# Patient Record
Sex: Female | Born: 1955
Health system: Southern US, Community
[De-identification: ages and names within clinical notes are randomized; demographics above are authoritative.]

## PROBLEM LIST (undated history)

## (undated) DIAGNOSIS — T7840XA Allergy, unspecified, initial encounter: Secondary | ICD-10-CM

## (undated) HISTORY — DX: Allergy, unspecified, initial encounter: T78.40XA

## (undated) HISTORY — PX: ABDOMINAL HYSTERECTOMY: SHX81

---

## 1998-05-23 ENCOUNTER — Other Ambulatory Visit: Admission: RE | Admit: 1998-05-23 | Discharge: 1998-05-23 | Payer: Self-pay | Admitting: *Deleted

## 1998-07-26 ENCOUNTER — Other Ambulatory Visit: Admission: RE | Admit: 1998-07-26 | Discharge: 1998-07-26 | Payer: Self-pay | Admitting: *Deleted

## 1998-08-20 ENCOUNTER — Other Ambulatory Visit: Admission: RE | Admit: 1998-08-20 | Discharge: 1998-08-20 | Payer: Self-pay | Admitting: *Deleted

## 1999-01-09 ENCOUNTER — Other Ambulatory Visit: Admission: RE | Admit: 1999-01-09 | Discharge: 1999-01-09 | Payer: Self-pay | Admitting: *Deleted

## 2000-07-07 ENCOUNTER — Other Ambulatory Visit: Admission: RE | Admit: 2000-07-07 | Discharge: 2000-07-07 | Payer: Self-pay | Admitting: *Deleted

## 2011-12-14 ENCOUNTER — Ambulatory Visit (INDEPENDENT_AMBULATORY_CARE_PROVIDER_SITE_OTHER): Payer: BC Managed Care – PPO | Admitting: Internal Medicine

## 2011-12-14 DIAGNOSIS — IMO0001 Reserved for inherently not codable concepts without codable children: Secondary | ICD-10-CM

## 2011-12-14 DIAGNOSIS — M791 Myalgia, unspecified site: Secondary | ICD-10-CM
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
## 2011-12-16 ENCOUNTER — Ambulatory Visit (INDEPENDENT_AMBULATORY_CARE_PROVIDER_SITE_OTHER): Payer: BC Managed Care – PPO | Admitting: Family Medicine

## 2011-12-16 VITALS — BP 116/66 | HR 57 | Temp 99.1°F | Resp 16 | Ht 66.75 in | Wt 135.0 lb

## 2011-12-16 DIAGNOSIS — Z043 Encounter for examination and observation following other accident: Secondary | ICD-10-CM

## 2011-12-16 DIAGNOSIS — Z531 Procedure and treatment not carried out because of patient's decision for reasons of belief and group pressure: Secondary | ICD-10-CM | POA: Insufficient documentation

## 2011-12-16 DIAGNOSIS — IMO0001 Reserved for inherently not codable concepts without codable children: Secondary | ICD-10-CM

## 2011-12-16 DIAGNOSIS — M542 Cervicalgia: Secondary | ICD-10-CM
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
## 2011-12-21 ENCOUNTER — Telehealth: Payer: Self-pay
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
## 2011-12-21 NOTE — Telephone Encounter (Signed)
LM with female to CB to give her message from Dr. Milus Glazier

## 2011-12-22 NOTE — Telephone Encounter (Signed)
Spoke with pt and explained Dr Loma Boston message. Pt verbalized understanding and agreed that if pain increases or Sxs persist she will RTC for xray.

## 2012-01-09 ENCOUNTER — Ambulatory Visit (INDEPENDENT_AMBULATORY_CARE_PROVIDER_SITE_OTHER): Payer: BC Managed Care – PPO | Admitting: Family Medicine

## 2012-01-09 ENCOUNTER — Ambulatory Visit: Payer: BC Managed Care – PPO

## 2012-01-09 VITALS — BP 112/60 | HR 46 | Temp 98.4°F | Resp 14 | Ht 67.0 in | Wt 139.0 lb

## 2012-01-09 DIAGNOSIS — S139XXA Sprain of joints and ligaments of unspecified parts of neck, initial encounter: Secondary | ICD-10-CM

## 2012-01-09 DIAGNOSIS — S339XXA Sprain of unspecified parts of lumbar spine and pelvis, initial encounter: Secondary | ICD-10-CM

## 2012-01-09 DIAGNOSIS — M549 Dorsalgia, unspecified: Secondary | ICD-10-CM

## 2012-01-09 MED ORDER — METHOCARBAMOL 750 MG PO TABS: 750.0000 mg | ORAL_TABLET | Freq: Three times a day (TID) | ORAL | Status: AC

## 2012-01-09 MED ORDER — MELOXICAM 7.5 MG PO TABS: 7.5000 mg | ORAL_TABLET | Freq: Two times a day (BID) | ORAL | Status: AC
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
## 2012-01-09 NOTE — Patient Instructions (Signed)
Do stretching as discussed. Take medications. Use heat on neck and back. Call with status in 1 week.  Sooner if worse.

## 2012-01-18 ENCOUNTER — Telehealth: Payer: Self-pay

## 2012-01-18 ENCOUNTER — Encounter: Payer: Self-pay | Admitting: Physician Assistant

## 2012-01-18 DIAGNOSIS — M542 Cervicalgia: Secondary | ICD-10-CM

## 2012-01-18 NOTE — Telephone Encounter (Signed)
I spoke with pt after calling her back from her OV.  She has not used her meds as of now because she is hesitant and does not want to mask anything.  She would like to pursue PT.  Will put in order for this to be done.

## 2012-01-18 NOTE — Telephone Encounter (Signed)
FOR ALICIA ONLY PT WOULD LIKE A CALL BACK FROM YOU REGARDING P.T. THAT YOU BOTH HAD MENTIONED. REALLY WANT TO SPEAK WITH YOU TO KNOW WHAT SHE SHOULD BE LOOKING FOR PLEASE CALL [phone]

## 2012-01-18 NOTE — Progress Notes (Signed)
This encounter was created in error - please disregard.

## 2012-02-01 ENCOUNTER — Telehealth: Payer: Self-pay

## 2012-02-01 NOTE — Telephone Encounter (Signed)
Spoke with patient aware that records are ready for pickup

## 2012-02-01 NOTE — Telephone Encounter (Signed)
PT WOULD LIKE TO HAVE 2 COPIES OF HER OV NOTES FROM 2-13 TO PRESENT PLEASE CALL [phone] WHEN READY FOR P/U

## 2012-03-26 DIAGNOSIS — Z0271 Encounter for disability determination: Secondary | ICD-10-CM

## 2013-03-20 ENCOUNTER — Emergency Department (HOSPITAL_COMMUNITY)
Admission: EM | Admit: 2013-03-20 | Discharge: 2013-03-20 | Disposition: A | Discharge status: Home / Self Care | Payer: BC Managed Care – PPO | Attending: Emergency Medicine | Admitting: Emergency Medicine

## 2013-03-20 ENCOUNTER — Emergency Department (HOSPITAL_COMMUNITY): Payer: BC Managed Care – PPO

## 2013-03-20 ENCOUNTER — Encounter (HOSPITAL_COMMUNITY): Payer: Self-pay | Admitting: Emergency Medicine

## 2013-03-20 DIAGNOSIS — S6990XA Unspecified injury of unspecified wrist, hand and finger(s), initial encounter: Secondary | ICD-10-CM | POA: Insufficient documentation

## 2013-03-20 DIAGNOSIS — IMO0002 Reserved for concepts with insufficient information to code with codable children: Secondary | ICD-10-CM | POA: Insufficient documentation

## 2013-03-20 DIAGNOSIS — W540XXA Bitten by dog, initial encounter: Secondary | ICD-10-CM | POA: Insufficient documentation

## 2013-03-20 DIAGNOSIS — Y92009 Unspecified place in unspecified non-institutional (private) residence as the place of occurrence of the external cause: Secondary | ICD-10-CM | POA: Insufficient documentation

## 2013-03-20 DIAGNOSIS — Z880 Allergy status to penicillin: Secondary | ICD-10-CM | POA: Insufficient documentation

## 2013-03-20 DIAGNOSIS — R296 Repeated falls: Secondary | ICD-10-CM | POA: Insufficient documentation

## 2013-03-20 DIAGNOSIS — Z23 Encounter for immunization: Secondary | ICD-10-CM | POA: Insufficient documentation

## 2013-03-20 DIAGNOSIS — S71109A Unspecified open wound, unspecified thigh, initial encounter: Secondary | ICD-10-CM | POA: Insufficient documentation

## 2013-03-20 DIAGNOSIS — M79644 Pain in right finger(s): Secondary | ICD-10-CM

## 2013-03-20 DIAGNOSIS — T148XXA Other injury of unspecified body region, initial encounter: Secondary | ICD-10-CM

## 2013-03-20 DIAGNOSIS — Y9389 Activity, other specified: Secondary | ICD-10-CM | POA: Insufficient documentation

## 2013-03-20 DIAGNOSIS — S71009A Unspecified open wound, unspecified hip, initial encounter: Secondary | ICD-10-CM | POA: Insufficient documentation

## 2013-03-20 MED ORDER — TETANUS-DIPHTH-ACELL PERTUSSIS 5-2.5-18.5 LF-MCG/0.5 IM SUSP
0.5000 mL | Freq: Once | INTRAMUSCULAR | Status: AC
  Administered 2013-03-20: 0.5 mL via INTRAMUSCULAR
  Filled 2013-03-20: qty 0.5

## 2013-03-20 MED ORDER — TETANUS-DIPHTHERIA TOXOIDS TD 5-2 LFU IM INJ
0.5000 mL | INJECTION | Freq: Once | INTRAMUSCULAR | Status: DC
  Filled 2013-03-20: qty 0.5

## 2013-03-20 MED ORDER — DOXYCYCLINE HYCLATE 100 MG PO CAPS: 100.0000 mg | ORAL_CAPSULE | Freq: Two times a day (BID) | ORAL | Status: DC

## 2013-03-20 NOTE — ED Provider Notes (Signed)
History     CSN: [id number]  Arrival date & time 03/20/13  1748   First MD Initiated Contact with Patient 03/20/13 1811      Chief Complaint  Patient presents with  . Animal Bite    (Consider location/radiation/quality/duration/timing/severity/associated sxs/prior treatment) HPI Comments: Shelby Powell is a 57 y/o F presenting to the ED after a dog bite. Patient stated that she was talking with a neighbor and when the neighbor went to take the puppy out of her house the bigger dog got out and attacked her. Patient stated that she tried to ward off the dog with little success. Stated that she was bite in her right inner thigh and that her skirt was ripped. Patient stated that as she was trying to ward off the dog she felt backwards, landing on her buttock and on her hand. Stated that she has swelling and pain with motion to her right ring finger. Stated that she has pain to the right ring finger with motion and has mild swelling. Denied numbness, tingling, fever, chills, sweating, drainage from wound, chest pain, shortness of breath, difficulty breathing, changes to vision.   Patient is a 57 y.o. female presenting with animal bite. The history is provided by the patient. No language interpreter was used.  Animal Bite Associated symptoms: no fever, no numbness and no rash     History reviewed. No pertinent past medical history.  Past Surgical History  Procedure Laterality Date  . Abdominal hysterectomy      No family history on file.  History  Substance Use Topics  . Smoking status: Never Smoker   . Smokeless tobacco: Not on file  . Alcohol Use: Not on file    OB History   Grav Para Term Preterm Abortions TAB SAB Ect Mult Living                  Review of Systems  Constitutional: Negative for fever, chills and fatigue.  HENT: Negative for ear pain, sore throat, trouble swallowing, neck pain and neck stiffness.   Eyes: Negative for photophobia, pain and visual  disturbance.  Respiratory: Negative for cough, chest tightness and shortness of breath.   Cardiovascular: Negative for chest pain.  Gastrointestinal: Negative for nausea, vomiting, abdominal pain, diarrhea and constipation.  Genitourinary: Negative for dysuria and difficulty urinating.  Musculoskeletal: Positive for arthralgias (right ring finger). Negative for back pain. Joint swelling: right ring finger.  Skin: Positive for wound. Negative for rash.  Neurological: Negative for dizziness, weakness, light-headedness, numbness and headaches.  All other systems reviewed and are negative.    Allergies  Penicillins  Home Medications   Current Outpatient Rx  Name  Route  Sig  Dispense  Refill  . doxycycline (VIBRAMYCIN) 100 MG capsule   Oral   Take 1 capsule (100 mg total) by mouth 2 (two) times daily. One po bid x 7 days   14 capsule   0     BP 120/76  Pulse 65  Temp(Src) 98.8 F (37.1 C) (Oral)  Resp 20  Wt 138 lb (62.596 kg)  BMI 21.61 kg/m2  SpO2 99%  LMP 09/25/1984  Physical Exam  Nursing note and vitals reviewed. Constitutional: She is oriented to person, place, and time. She appears well-developed and well-nourished. No distress.  HENT:  Head: Normocephalic and atraumatic.  Mouth/Throat: Oropharynx is clear and moist. No oropharyngeal exudate.  Uvula midline, symmetrical elevation   Eyes: Conjunctivae are normal. Pupils are equal, round, and reactive to light.  Right eye exhibits no discharge. Left eye exhibits no discharge.  Neck: Normal range of motion. Neck supple. No thyromegaly present.  Negative neck stiffness  Negative nuchal rigidity Negative lymphadenopathy  Cardiovascular: Normal rate, regular rhythm and normal heart sounds.  Exam reveals no friction rub.   No murmur heard. Pulses:      Radial pulses are 2+ on the right side, and 2+ on the left side.       Dorsalis pedis pulses are 2+ on the right side, and 2+ on the left side.  Pulmonary/Chest: Effort  normal and breath sounds normal. No respiratory distress. She has no wheezes. She has no rales.  Musculoskeletal: Normal range of motion. She exhibits no edema and no tenderness.  Full ROM to upper and lower extremities, bilaterally Strength 5+/5+ to upper and lower extremities, bilaterally  Swelling noted to the right ring finger - decreased ROM to the right ring finger secondary to pain. Negative erythema, inflammation, drainage noted to the right ring finger   Lymphadenopathy:    She has no cervical adenopathy.  Neurological: She is alert and oriented to person, place, and time. No cranial nerve deficit. She exhibits normal muscle tone. Coordination normal.  Sensation intact to upper and lower extremities, bilaterally, with differentiation to sharp and dull touch  Skin: Skin is warm and dry. No rash noted. She is not diaphoretic.  Abrasion noted to the right wrist  Small, approximately 0.5 cm, superficial puncture wound to the right inner thigh - negative erythema, inflammation, swelling, drainage noted  Psychiatric: She has a normal mood and affect. Her behavior is normal. Thought content normal.    ED Course  Procedures (including critical care time)  Labs Reviewed - No data to display Dg Hand Complete Right  03/20/2013   *RADIOLOGY REPORT*  Clinical Data: Injury of the ring finger.  Fell.  Pain.  RIGHT HAND - COMPLETE 3+ VIEW  Comparison: None.  Findings: There is no evidence for acute fracture or dislocation. No soft tissue foreign body or gas identified.  IMPRESSION: Negative exam.   Original Report Authenticated By: Norva Pavlov, M.D.     1. Animal bite   2. Finger pain, right       MDM  Patient afebrile, normotensive, non-tachycardic, no tachypnea, adequate saturation on room air, alert and oriented.  Dog has rabies vaccine - Rabies tag # [id number], 03/27/2014 - Dr. Elizebeth Brooking  Patient given Tetanus in ED setting Wounds cleaned by nurse.  Negative fracture or acute injury to  right ring x-ray. Patient placed in finger splint for comfort. Patient aseptic, non-toxic appearing, in acute distress. Negative septic infection noted - patient afebrile and wounds in good healing process. Patient discharged. Discharged with doxycycline for coverage. Discussed wound care and educated patient on how to. Referred to PCP, orthopedics, and wound care clinic. Discussed with patient to rest and stay hydrated. Discussed with patient to keep finger in splint and to keep elevated and to ice. Discussed with patient to continue to monitor symptoms and if symptoms are to worsen or change to report back to the ED. Patient agreed to plan of care, understood, all questions answered.     Raymon Mutton, PA-C 03/21/13 [phone]

## 2013-03-20 NOTE — ED Notes (Signed)
Patient was visiting a friend when friend's dog rushed at her biting her right thigh and scratches on right wrist.  Patient moved quickly and injured right 4th finger which is swollen and painful with movement.  Dog has had his rabies vaccination, but neighbor reports dog has attacked others.

## 2013-03-20 NOTE — ED Notes (Signed)
[employer] contacted and information given concerning dog bite incident.  Report filed.

## 2013-03-23 ENCOUNTER — Telehealth: Payer: Self-pay

## 2013-03-23 NOTE — Telephone Encounter (Signed)
Patient would like Dr Georgia Duff  to call her regarding the dog bite she went to the emergency room for please call her at [phone]

## 2013-03-23 NOTE — Telephone Encounter (Signed)
Spoke to her she went to ER was told to call and let us know she was seen.  She is advised if she has any trouble to let us know.

## 2013-03-23 NOTE — Telephone Encounter (Signed)
Called her left message for her to call me back, Amy

## 2013-03-23 NOTE — ED Provider Notes (Signed)
Medical screening examination/treatment/procedure(s) were performed by non-physician practitioner and as supervising physician I was immediately available for consultation/collaboration.   Chibuikem Thang L Anushri Casalino, MD 03/23/13 1523 

## 2013-04-10 ENCOUNTER — Ambulatory Visit (INDEPENDENT_AMBULATORY_CARE_PROVIDER_SITE_OTHER): Payer: BC Managed Care – PPO | Admitting: Family Medicine

## 2013-04-10 VITALS — BP 110/69 | HR 52 | Temp 98.0°F | Resp 16 | Ht 67.0 in | Wt 140.6 lb

## 2013-04-10 DIAGNOSIS — S63619A Unspecified sprain of unspecified finger, initial encounter: Secondary | ICD-10-CM

## 2013-04-10 DIAGNOSIS — S6390XA Sprain of unspecified part of unspecified wrist and hand, initial encounter: Secondary | ICD-10-CM

## 2013-04-10 NOTE — Progress Notes (Signed)
57 yo with sprained right ring  Finger over [employer] after attack by friend's dog.  Has been splinting with metal PIP splint.  rec'd [employer] at [facility]   Objective: NAD Right ring finger PIP is mildly swollen and tender.  Incomplete flexion. Films reviewed with patient  Assessment:  Finger sprain.  Plan:  Buddy tape the fingers, recheck in 2+ weeks  Signed, Cali Hope, MD

## 2013-04-26 ENCOUNTER — Telehealth: Payer: Self-pay

## 2013-04-26 ENCOUNTER — Ambulatory Visit (INDEPENDENT_AMBULATORY_CARE_PROVIDER_SITE_OTHER): Payer: BC Managed Care – PPO | Admitting: Family Medicine

## 2013-04-26 ENCOUNTER — Ambulatory Visit: Payer: BC Managed Care – PPO

## 2013-04-26 VITALS — BP 111/65 | HR 52 | Temp 98.0°F | Resp 16 | Ht 67.0 in | Wt 138.0 lb

## 2013-04-26 DIAGNOSIS — M79644 Pain in right finger(s): Secondary | ICD-10-CM

## 2013-04-26 DIAGNOSIS — M79609 Pain in unspecified limb: Secondary | ICD-10-CM

## 2013-04-26 DIAGNOSIS — T148XXA Other injury of unspecified body region, initial encounter: Secondary | ICD-10-CM

## 2013-04-26 NOTE — Progress Notes (Addendum)
  [facility]:  Office Visit  Chief Complaint:  Chief Complaint  Patient presents with  . Follow-up    Rt ring finger    HPI: Shelby Powell is a 57 y.o. female who complains of  Right ring finger sprain recheck, occurred on 5/26 after she fell when a chihuahua attacked her. Xrays were negative for fracture. She has been buddy taping it and doing better. No numbness, weakness or tingling. Has taken ibuprofen intermittently. More swelling with movement. Minimal to no pain.   No past medical history on file. Past Surgical History  Procedure Laterality Date  . Abdominal hysterectomy     History   Social History  . Marital Status: Married    Spouse Name: N/A    Number of Children: N/A  . Years of Education: N/A   Social History Main Topics  . Smoking status: Never Smoker   . Smokeless tobacco: None  . Alcohol Use: None  . Drug Use: None  . Sexually Active: None   Other Topics Concern  . None   Social History Narrative  . None   No family history on file. Allergies  Allergen Reactions  . Penicillins Hives and Itching   Prior to Admission medications   Medication Sig Start Date End Date Taking? Authorizing Provider  doxycycline (VIBRAMYCIN) 100 MG capsule Take 1 capsule (100 mg total) by mouth 2 (two) times daily. One po bid x 7 days 03/20/13   Marissa Sciacca, PA-C     ROS: The patient denies fevers, chills, night sweats, unintentional weight loss, chest pain, palpitations, wheezing, dyspnea on exertion, nausea, vomiting, abdominal pain, dysuria, hematuria, melena, numbness, weakness, or tingling.   All other systems have been reviewed and were otherwise negative with the exception of those mentioned in the HPI and as above.    PHYSICAL EXAM: Filed Vitals:   04/26/13 0832  BP: 111/65  Pulse: 52  Temp: 98 F (36.7 C)  Resp: 16   Filed Vitals:   04/26/13 0832  Height: 5\' 7"  (1.702 m)  Weight: 138 lb (62.596 kg)   Body mass index is  21.61 kg/(m^2).  General: Alert, no acute distress HEENT:  Normocephalic, atraumatic, oropharynx patent.  Cardiovascular:  Regular rate and rhythm, no rubs murmurs or gallops.  No Carotid bruits, radial pulse intact. No pedal edema.  Respiratory: Clear to auscultation bilaterally.  No wheezes, rales, or rhonchi.  No cyanosis, no use of accessory musculature GI: No organomegaly, abdomen is soft and non-tender, positive bowel sounds.  No masses. Skin: No rashes. Neurologic: Facial musculature symmetric. Psychiatric: Patient is appropriate throughout our interaction. Lymphatic: No cervical lymphadenopathy Musculoskeletal: Gait intact. + swelling at ring finger right side, + tenderness PIP 5/5 strength   LABS: No results found for this or any previous visit.   EKG/XRAY:   Primary read interpreted by Dr. Conley Rolls at [facility] No obvious acute fx/dislocation ? Shadow vs healing subacute fx proximal phalanx    ASSESSMENT/PLAN: Encounter Diagnoses  Name Primary?  . Finger pain, right Yes  . Sprain and strain    ROM NSAID and RICE Buddy tape prn Will call with official radiology results F/u prn    LE, THAO PHUONG, DO 04/26/2013 9:45 AM    04/27/13 Spoke with patient about her negative xray results.

## 2013-04-26 NOTE — Telephone Encounter (Signed)
Pt is needing to talk with dr Milus Glazier about her visit from earlier today for a different provider  Best number [phone]

## 2013-04-27 ENCOUNTER — Telehealth: Payer: Self-pay | Admitting: Family Medicine

## 2013-04-27 NOTE — Telephone Encounter (Signed)
Index finger remains swollen and tender on volar side of PIP.  She is frustrated with lack of progress.   I believe patient has a tear of her volar plate.  I asked her to continue buddy taping and immobilize the PIP joint until she can get back in to recheck the finger on the 13th of this month (she's out of town now).

## 2013-04-27 NOTE — Telephone Encounter (Signed)
Called her. She did have office visit with Dr Conley Rolls. She states she still has swelling of her finger for over a month. Wants to know what else she can do. She has begun ROM of her finger, and is taping at night.  please advise.

## 2014-02-08 IMAGING — CR DG HAND COMPLETE 3+V*R*
3 series · 3 of 3 positions shown · non-contrast
Comparison: None.

CLINICAL DATA: Injury of the ring finger.  Fell.  Pain.

RIGHT HAND - COMPLETE 3+ VIEW

[x hand pa right]
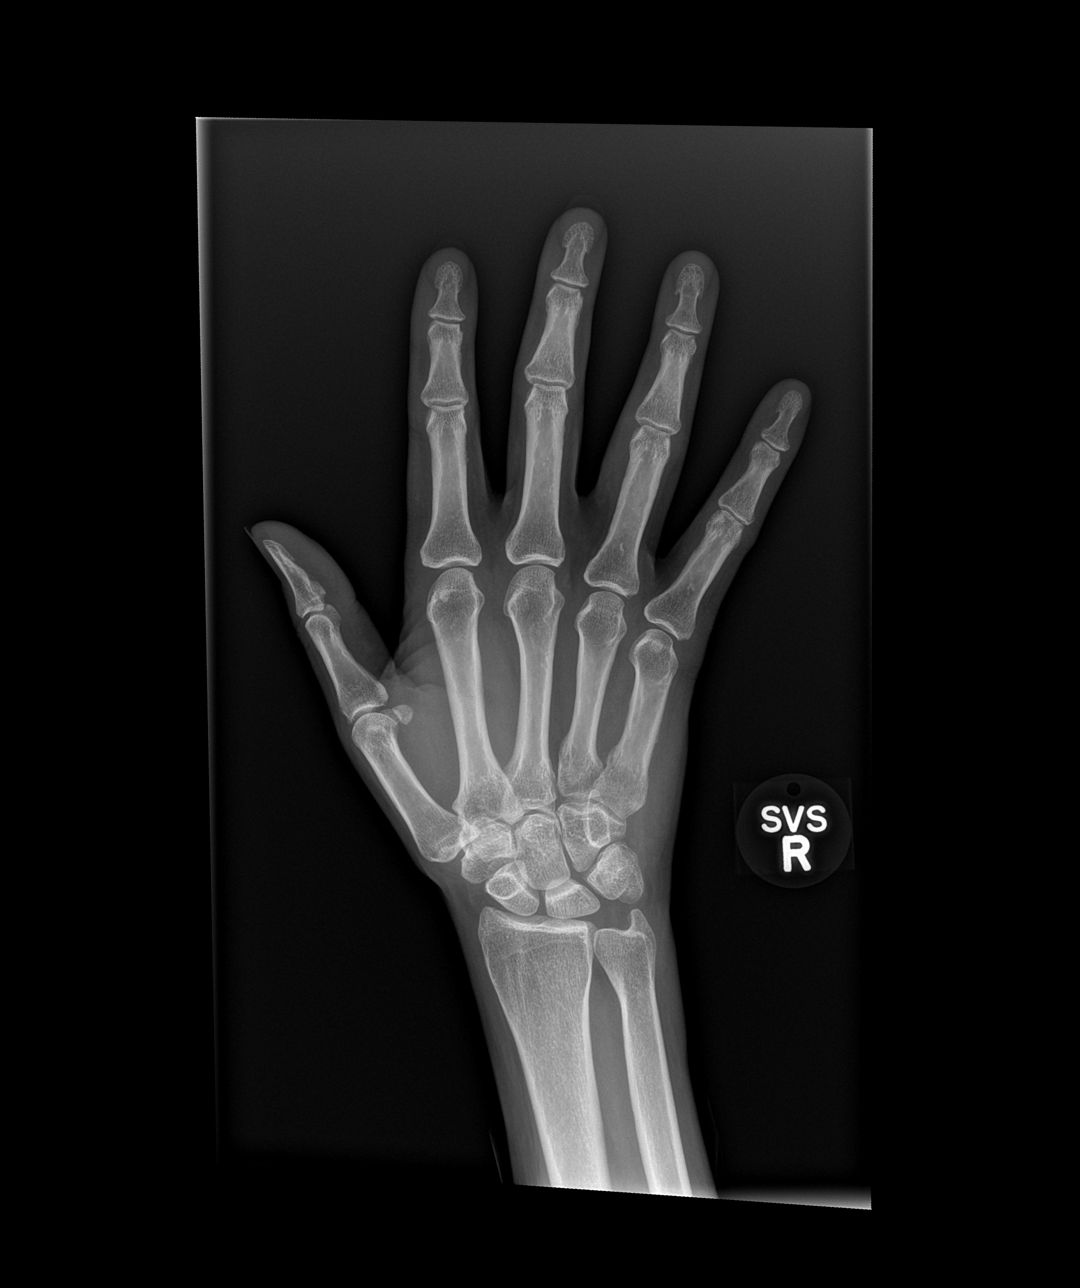

[x hand obl right]
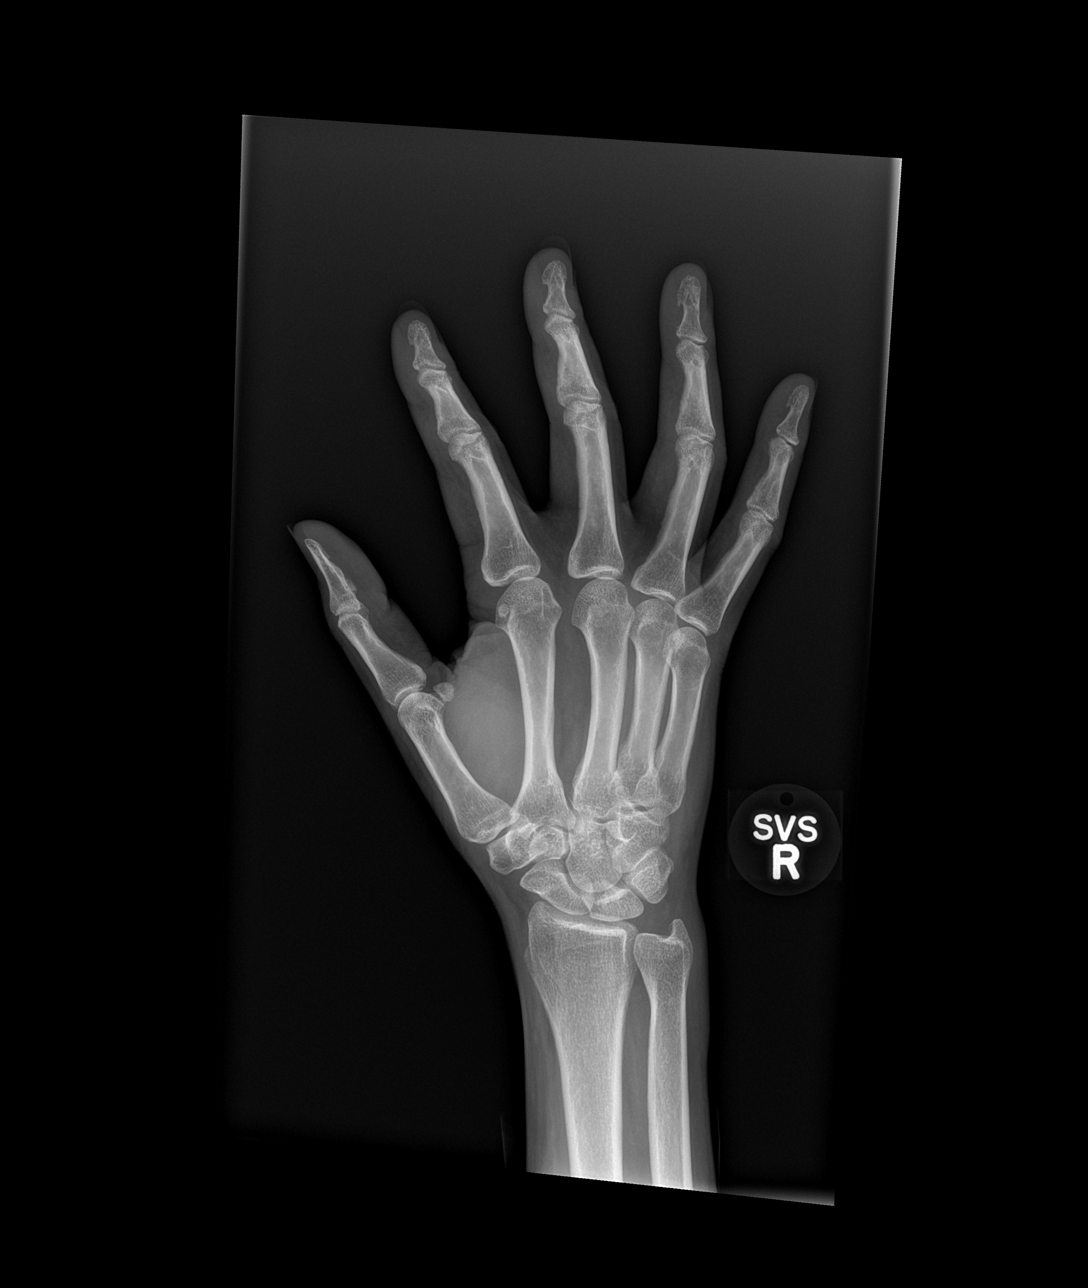

[x hand lat right]
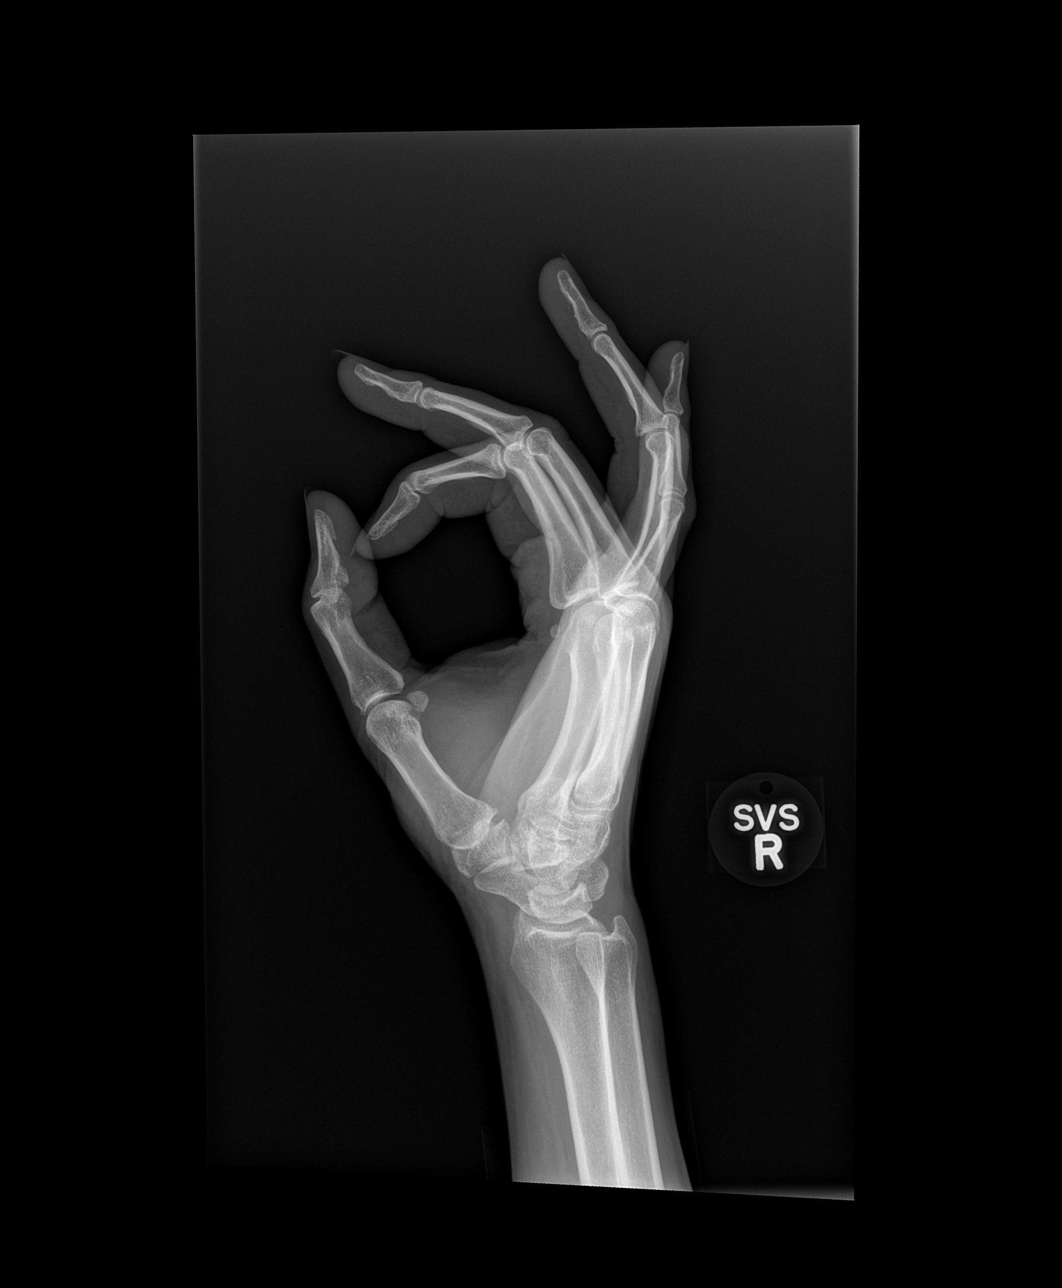

[3 of 3 positions shown; findings below may reference images not displayed]

FINDINGS: There is no evidence for acute fracture or dislocation.
No soft tissue foreign body or gas identified.
IMPRESSION: Negative exam.

## 2014-03-17 IMAGING — CR DG FINGER RING 2+V*R*
1 series · 1 of 1 positions shown · non-contrast
Comparison: Right hand radiographs 03/20/2013

CLINICAL DATA: Right ring finger sprain, recheck, post dog attack

RIGHT RING FINGER 2+V

[PA]
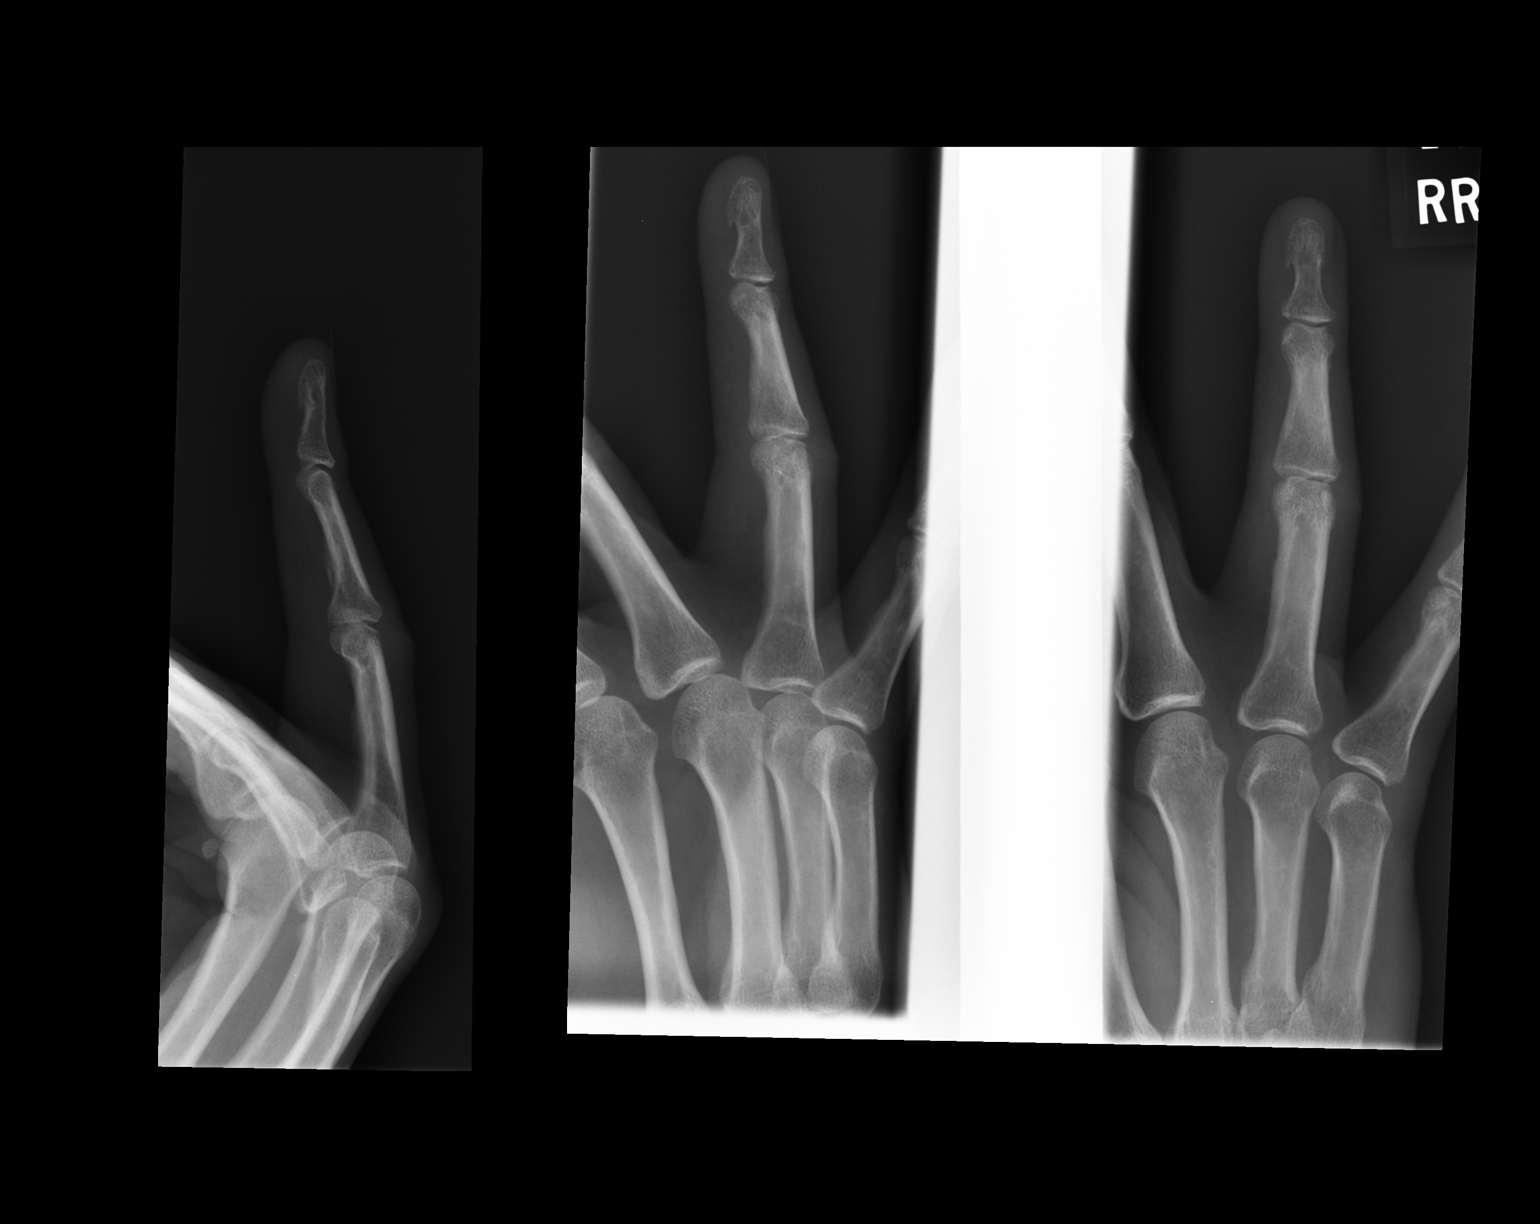

[1 of 1 positions shown; findings below may reference images not displayed]

FINDINGS: Decreased soft tissue swelling.
Osseous mineralization normal.
Joint spaces preserved.
No acute fracture, dislocation, or bone destruction.
IMPRESSION: No acute osseous abnormalities.

## 2014-04-16 ENCOUNTER — Ambulatory Visit (INDEPENDENT_AMBULATORY_CARE_PROVIDER_SITE_OTHER): Payer: BC Managed Care – PPO | Admitting: Family Medicine

## 2014-04-16 VITALS — BP 120/80 | HR 57 | Temp 98.9°F | Resp 16 | Ht 67.0 in | Wt 142.8 lb

## 2014-04-16 DIAGNOSIS — R059 Cough, unspecified: Secondary | ICD-10-CM

## 2014-04-16 DIAGNOSIS — R05 Cough: Secondary | ICD-10-CM

## 2014-04-16 DIAGNOSIS — J069 Acute upper respiratory infection, unspecified: Secondary | ICD-10-CM

## 2014-04-16 MED ORDER — BENZONATATE 100 MG PO CAPS: 100.0000 mg | ORAL_CAPSULE | Freq: Three times a day (TID) | ORAL | Status: DC | PRN

## 2014-04-16 NOTE — Progress Notes (Signed)
Subjective:    Patient ID: Shelby Powell, female    DOB: [birth year], 58 y.o.   MRN: [id number]  HPI Shelby Powell is a 58 y.o. female  Initial sore throat, slight fever last Tuesday - tmax 100.8.Marland Kitchen  Started 6 days ago.  Fever broke after 2 days. Nasal congestion., has now had cough at night mostly since last week. Dry cough mostly.  Not discolored phlegm when productive. No dyspnea.  Same cough past few days.  Irritating cough, but able to sleep usually. Is sleeping elevated. No heartburn, slight PND/nasal congestion - but improving.   Tx: nasal spray otc (herbal).  Sick contact - husband with similar sx's - improved.    Patient Active Problem List   Diagnosis Date Noted  . Refusal of blood transfusions as patient is Jehov[employer]'s Witness 12/16/2011   History reviewed. No pertinent past medical history. Past Surgical History  Procedure Laterality Date  . Abdominal hysterectomy     Allergies  Allergen Reactions  . Penicillins Hives and Itching   Prior to Admission medications   Not on File   History   Social History  . Marital Status: Married    Spouse Name: N/A    Number of Children: N/A  . Years of Education: N/A   Occupational History  . Not on file.   Social History Main Topics  . Smoking status: Never Smoker   . Smokeless tobacco: Not on file  . Alcohol Use: Not on file  . Drug Use: Not on file  . Sexual Activity: Not on file   Other Topics Concern  . Not on file   Social History Narrative  . No narrative on file       Review of Systems  Constitutional: Negative for fever (initially only. ) and chills.  HENT: Positive for congestion and rhinorrhea.   Respiratory: Positive for cough. Negative for shortness of breath.        Objective:   Physical Exam  Vitals reviewed. Constitutional: She is oriented to person, place, and time. She appears well-developed and well-nourished. No distress.  HENT:  Head: Normocephalic and atraumatic.  Right  Ear: Hearing, tympanic membrane, external ear and ear canal normal.  Left Ear: Hearing, tympanic membrane, external ear and ear canal normal.  Nose: Nose normal. Right sinus exhibits no maxillary sinus tenderness and no frontal sinus tenderness. Left sinus exhibits no maxillary sinus tenderness and no frontal sinus tenderness.  Mouth/Throat: Oropharynx is clear and moist. No oropharyngeal exudate.  Eyes: Conjunctivae and EOM are normal. Pupils are equal, round, and reactive to light.  Cardiovascular: Normal rate, regular rhythm, normal heart sounds and intact distal pulses.   No murmur heard. Pulmonary/Chest: Effort normal and breath sounds normal. No respiratory distress. She has no wheezes. She has no rhonchi.  Neurological: She is alert and oriented to person, place, and time.  Skin: Skin is warm and dry. No rash noted.  Psychiatric: She has a normal mood and affect. Her behavior is normal.   . Filed Vitals:   04/16/14 0820  BP: 120/80  Pulse: 57  Temp: 98.9 F (37.2 C)  TempSrc: Oral  Resp: 16  Height: 5\' 7"  (1.702 m)  Weight: 142 lb 12.8 oz (64.774 kg)  SpO2: 99%          Assessment & Plan:   Shelby Powell is a 58 y.o. female Cough - Plan: benzonatate (TESSALON) 100 MG capsule  Acute upper respiratory infections of unspecified site - Plan: benzonatate (TESSALON)  100 MG capsule  Suspected viral URI, improving with slight cough from PND likely.  Clear exam.  reassurance given, sx care discussed, tessalon or mucinex. rtc precautions.   Meds ordered this encounter  Medications  . benzonatate (TESSALON) 100 MG capsule    Sig: Take 1 capsule (100 mg total) by mouth 3 (three) times daily as needed for cough.    Dispense:  20 capsule    Refill:  0   Patient Instructions  Saline nasal spray atleast 4 times per day for congestion if needed, over the counter mucinex DM or prescribed tessalon perles for cough if needed, drink plenty of fluids. Let me know if cough keeping  you up at night - can prescribe different cough medicine. Return to the clinic or go to the nearest emergency room if any of your symptoms worsen or new symptoms occur.   Upper Respiratory Infection, Adult An upper respiratory infection (URI) is also sometimes known as the common cold. The upper respiratory tract includes the nose, sinuses, throat, trachea, and bronchi. Bronchi are the airways leading to the lungs. Most people improve within 1 week, but symptoms can last up to 2 weeks. A residual cough may last even longer.  CAUSES Many different viruses can infect the tissues lining the upper respiratory tract. The tissues become irritated and inflamed and often become very moist. Mucus production is also common. A cold is contagious. You can easily spread the virus to others by oral contact. This includes kissing, sharing a glass, coughing, or sneezing. Touching your mouth or nose and then touching a surface, which is then touched by another person, can also spread the virus. SYMPTOMS  Symptoms typically develop 1 to 3 days after you come in contact with a cold virus. Symptoms vary from person to person. They may include:  Runny nose.  Sneezing.  Nasal congestion.  Sinus irritation.  Sore throat.  Loss of voice (laryngitis).  Cough.  Fatigue.  Muscle aches.  Loss of appetite.  Headache.  Low-grade fever. DIAGNOSIS  You might diagnose your own cold based on familiar symptoms, since most people get a cold 2 to 3 times a year. Your caregiver can confirm this based on your exam. Most importantly, your caregiver can check that your symptoms are not due to another disease such as strep throat, sinusitis, pneumonia, asthma, or epiglottitis. Blood tests, throat tests, and X-rays are not necessary to diagnose a common cold, but they may sometimes be helpful in excluding other more serious diseases. Your caregiver will decide if any further tests are required. RISKS AND COMPLICATIONS  You  may be at risk for a more severe case of the common cold if you smoke cigarettes, have chronic heart disease (such as heart failure) or lung disease (such as asthma), or if you have a weakened immune system. The very young and very old are also at risk for more serious infections. Bacterial sinusitis, middle ear infections, and bacterial pneumonia can complicate the common cold. The common cold can worsen asthma and chronic obstructive pulmonary disease (COPD). Sometimes, these complications can require emergency medical care and may be life-threatening. PREVENTION  The best way to protect against getting a cold is to practice good hygiene. Avoid oral or hand contact with people with cold symptoms. Wash your hands often if contact occurs. There is no clear evidence that vitamin C, vitamin E, echinacea, or exercise reduces the chance of developing a cold. However, it is always recommended to get plenty of rest and practice  good nutrition. TREATMENT  Treatment is directed at relieving symptoms. There is no cure. Antibiotics are not effective, because the infection is caused by a virus, not by bacteria. Treatment may include:  Increased fluid intake. Sports drinks offer valuable electrolytes, sugars, and fluids.  Breathing heated mist or steam (vaporizer or shower).  Eating chicken soup or other clear broths, and maintaining good nutrition.  Getting plenty of rest.  Using gargles or lozenges for comfort.  Controlling fevers with ibuprofen or acetaminophen as directed by your caregiver.  Increasing usage of your inhaler if you have asthma. Zinc gel and zinc lozenges, taken in the first 24 hours of the common cold, can shorten the duration and lessen the severity of symptoms. Pain medicines may help with fever, muscle aches, and throat pain. A variety of non-prescription medicines are available to treat congestion and runny nose. Your caregiver can make recommendations and may suggest nasal or lung  inhalers for other symptoms.  HOME CARE INSTRUCTIONS   Only take over-the-counter or prescription medicines for pain, discomfort, or fever as directed by your caregiver.  Use a warm mist humidifier or inhale steam from a shower to increase air moisture. This may keep secretions moist and make it easier to breathe.  Drink enough water and fluids to keep your urine clear or pale yellow.  Rest as needed.  Return to work when your temperature has returned to normal or as your caregiver advises. You may need to stay home longer to avoid infecting others. You can also use a face mask and careful hand washing to prevent spread of the virus. SEEK MEDICAL CARE IF:   After the first few days, you feel you are getting worse rather than better.  You need your caregiver's advice about medicines to control symptoms.  You develop chills, worsening shortness of breath, or brown or red sputum. These may be signs of pneumonia.  You develop yellow or brown nasal discharge or pain in the face, especially when you bend forward. These may be signs of sinusitis.  You develop a fever, swollen neck glands, pain with swallowing, or white areas in the back of your throat. These may be signs of strep throat. SEEK IMMEDIATE MEDICAL CARE IF:   You have a fever.  You develop severe or persistent headache, ear pain, sinus pain, or chest pain.  You develop wheezing, a prolonged cough, cough up blood, or have a change in your usual mucus (if you have chronic lung disease).  You develop sore muscles or a stiff neck. Document Released: 04/07/2001 Document Revised: 01/04/2012 Document Reviewed: 02/13/2011 [facility] Patient Information 2015 [place], [place]. This information is not intended to replace advice given to you by your health care provider. Make sure you discuss any questions you have with your health care provider.

## 2014-04-16 NOTE — Patient Instructions (Signed)
Saline nasal spray atleast 4 times per day for congestion if needed, over the counter mucinex DM or prescribed tessalon perles for cough if needed, drink plenty of fluids. Let me know if cough keeping you up at night - can prescribe different cough medicine. Return to the clinic or go to the nearest emergency room if any of your symptoms worsen or new symptoms occur.   Upper Respiratory Infection, Adult An upper respiratory infection (URI) is also sometimes known as the common cold. The upper respiratory tract includes the nose, sinuses, throat, trachea, and bronchi. Bronchi are the airways leading to the lungs. Most people improve within 1 week, but symptoms can last up to 2 weeks. A residual cough may last even longer.  CAUSES Many different viruses can infect the tissues lining the upper respiratory tract. The tissues become irritated and inflamed and often become very moist. Mucus production is also common. A cold is contagious. You can easily spread the virus to others by oral contact. This includes kissing, sharing a glass, coughing, or sneezing. Touching your mouth or nose and then touching a surface, which is then touched by another person, can also spread the virus. SYMPTOMS  Symptoms typically develop 1 to 3 days after you come in contact with a cold virus. Symptoms vary from person to person. They may include:  Runny nose.  Sneezing.  Nasal congestion.  Sinus irritation.  Sore throat.  Loss of voice (laryngitis).  Cough.  Fatigue.  Muscle aches.  Loss of appetite.  Headache.  Low-grade fever. DIAGNOSIS  You might diagnose your own cold based on familiar symptoms, since most people get a cold 2 to 3 times a year. Your caregiver can confirm this based on your exam. Most importantly, your caregiver can check that your symptoms are not due to another disease such as strep throat, sinusitis, pneumonia, asthma, or epiglottitis. Blood tests, throat tests, and X-rays are not  necessary to diagnose a common cold, but they may sometimes be helpful in excluding other more serious diseases. Your caregiver will decide if any further tests are required. RISKS AND COMPLICATIONS  You may be at risk for a more severe case of the common cold if you smoke cigarettes, have chronic heart disease (such as heart failure) or lung disease (such as asthma), or if you have a weakened immune system. The very young and very old are also at risk for more serious infections. Bacterial sinusitis, middle ear infections, and bacterial pneumonia can complicate the common cold. The common cold can worsen asthma and chronic obstructive pulmonary disease (COPD). Sometimes, these complications can require emergency medical care and may be life-threatening. PREVENTION  The best way to protect against getting a cold is to practice good hygiene. Avoid oral or hand contact with people with cold symptoms. Wash your hands often if contact occurs. There is no clear evidence that vitamin C, vitamin E, echinacea, or exercise reduces the chance of developing a cold. However, it is always recommended to get plenty of rest and practice good nutrition. TREATMENT  Treatment is directed at relieving symptoms. There is no cure. Antibiotics are not effective, because the infection is caused by a virus, not by bacteria. Treatment may include:  Increased fluid intake. Sports drinks offer valuable electrolytes, sugars, and fluids.  Breathing heated mist or steam (vaporizer or shower).  Eating chicken soup or other clear broths, and maintaining good nutrition.  Getting plenty of rest.  Using gargles or lozenges for comfort.  Controlling fevers with ibuprofen  or acetaminophen as directed by your caregiver.  Increasing usage of your inhaler if you have asthma. Zinc gel and zinc lozenges, taken in the first 24 hours of the common cold, can shorten the duration and lessen the severity of symptoms. Pain medicines may help  with fever, muscle aches, and throat pain. A variety of non-prescription medicines are available to treat congestion and runny nose. Your caregiver can make recommendations and may suggest nasal or lung inhalers for other symptoms.  HOME CARE INSTRUCTIONS   Only take over-the-counter or prescription medicines for pain, discomfort, or fever as directed by your caregiver.  Use a warm mist humidifier or inhale steam from a shower to increase air moisture. This may keep secretions moist and make it easier to breathe.  Drink enough water and fluids to keep your urine clear or pale yellow.  Rest as needed.  Return to work when your temperature has returned to normal or as your caregiver advises. You may need to stay home longer to avoid infecting others. You can also use a face mask and careful hand washing to prevent spread of the virus. SEEK MEDICAL CARE IF:   After the first few days, you feel you are getting worse rather than better.  You need your caregiver's advice about medicines to control symptoms.  You develop chills, worsening shortness of breath, or brown or red sputum. These may be signs of pneumonia.  You develop yellow or brown nasal discharge or pain in the face, especially when you bend forward. These may be signs of sinusitis.  You develop a fever, swollen neck glands, pain with swallowing, or white areas in the back of your throat. These may be signs of strep throat. SEEK IMMEDIATE MEDICAL CARE IF:   You have a fever.  You develop severe or persistent headache, ear pain, sinus pain, or chest pain.  You develop wheezing, a prolonged cough, cough up blood, or have a change in your usual mucus (if you have chronic lung disease).  You develop sore muscles or a stiff neck. Document Released: 04/07/2001 Document Revised: 01/04/2012 Document Reviewed: 02/13/2011 [facility] Patient Information 2015 [place], [place]. This information is not intended to replace advice given to you by  your health care provider. Make sure you discuss any questions you have with your health care provider.

## 2014-07-16 ENCOUNTER — Ambulatory Visit (INDEPENDENT_AMBULATORY_CARE_PROVIDER_SITE_OTHER): Payer: BC Managed Care – PPO | Admitting: Internal Medicine

## 2014-07-16 VITALS — BP 130/78 | HR 60 | Temp 97.9°F | Resp 16 | Ht 67.0 in | Wt 147.7 lb

## 2014-07-16 DIAGNOSIS — W57XXXA Bitten or stung by nonvenomous insect and other nonvenomous arthropods, initial encounter: Secondary | ICD-10-CM

## 2014-07-16 DIAGNOSIS — T148 Other injury of unspecified body region: Secondary | ICD-10-CM

## 2014-07-16 NOTE — Progress Notes (Signed)
   Subjective:    Patient ID: Shelby Powell, female    DOB: 1956-10-08, 58 y.o.   MRN: [id number]  HPI  58 y.o. Female presents to clinic for what she believed was a bug bite. Reports that it started as what she felt was a mosquito bite. Reports that she noticed it 2 days ago and a blister yesterday. Noticed it in the evening while sitting on a sofa. Bought a new house plant from [employer], unsure if she brought something in the house with this.   Review of Systems [place]    Objective:   Physical Exam BP 130/78  Pulse 60  Temp(Src) 97.9 F (36.6 C) (Oral)  Resp 16  Ht  (1.702 m)  Wt 147 lb 11.2 oz (66.996 kg)  BMI 23.13 kg/m2  SpO2 99%  LMP 09/25/1984  Small red lesion r shin with cental vesicle-no infection      Assessment & Plan:  Insect bite  Topical HC Keep clean reassured

## 2014-07-16 NOTE — Patient Instructions (Signed)
Spider Bite Spider bites are not common. Most spider bites do not cause serious problems. The elderly, very young children, and people with certain existing medical conditions are more likely to experience significant symptoms. SYMPTOMS  Spider bites may not cause any pain at first. Within 1 or 2 days of the bite, there may be swelling, redness, and pain in the bite area. However, some spider bites can cause pain within the first hour. TREATMENT  Your caregiver may prescribe antibiotic medicine if a bacterial infection develops in the bite. However, not all spider bites require antibiotics or prescription medicines.  HOME CARE INSTRUCTIONS  Do not scratch the bite area.  Keep the bite area clean and dry. Wash the area with soap and water as directed.  Put ice or cool compresses on the bite area.  Put ice in a plastic bag.  Place a towel between your skin and the bag.  Leave the ice on for 20 minutes, 4 times a day for the first 2 to 3 days, or as directed.  Keep the bite area elevated above the level of your heart. This helps reduce redness and swelling.  Only take over-the-counter or prescription medicines as directed by your caregiver.  If you are given antibiotics, take them as directed. Finish them even if you start to feel better. You may need a tetanus shot if:  You cannot remember when you had your last tetanus shot.  You have never had a tetanus shot.  The injury broke your skin. If you get a tetanus shot, your arm may swell, get red, and feel warm to the touch. This is common and not a problem. If you need a tetanus shot and you choose not to have one, there is a rare chance of getting tetanus. Sickness from tetanus can be serious. SEEK MEDICAL CARE IF: Your bite is not better after 3 days of treatment. SEEK IMMEDIATE MEDICAL CARE IF:  Your bite turns purple or develops increased swelling, pain, or redness.  You develop shortness of breath or chest pain.  You have  muscle cramps or painful muscle spasms.  You develop abdominal pain, nausea, or vomiting.  You feel unusually tired or sleepy. MAKE SURE YOU:  Understand these instructions.  Will watch your condition.  Will get help right away if you are not doing well or get worse. Document Released: 11/19/2004 Document Revised: 01/04/2012 Document Reviewed: 05/13/2011 [facility] Patient Information 2015 [place], [place]. This information is not intended to replace advice given to you by your health care provider. Make sure you discuss any questions you have with your health care provider. Insect Bite Mosquitoes, flies, fleas, bedbugs, and many other insects can bite. Insect bites are different from insect stings. A sting is when venom is injected into the skin. Some insect bites can transmit infectious diseases. SYMPTOMS  Insect bites usually turn red, swell, and itch for 2 to 4 days. They often go away on their own. TREATMENT  Your caregiver may prescribe antibiotic medicines if a bacterial infection develops in the bite. HOME CARE INSTRUCTIONS  Do not scratch the bite area.  Keep the bite area clean and dry. Wash the bite area thoroughly with soap and water.  Put ice or cool compresses on the bite area.  Put ice in a plastic bag.  Place a towel between your skin and the bag.  Leave the ice on for 20 minutes, 4 times a day for the first 2 to 3 days, or as directed.  You may  apply a baking soda paste, cortisone cream, or calamine lotion to the bite area as directed by your caregiver. This can help reduce itching and swelling.  Only take over-the-counter or prescription medicines as directed by your caregiver.  If you are given antibiotics, take them as directed. Finish them even if you start to feel better. You may need a tetanus shot if:  You cannot remember when you had your last tetanus shot.  You have never had a tetanus shot.  The injury broke your skin. If you get a tetanus shot,  your arm may swell, get red, and feel warm to the touch. This is common and not a problem. If you need a tetanus shot and you choose not to have one, there is a rare chance of getting tetanus. Sickness from tetanus can be serious. SEEK IMMEDIATE MEDICAL CARE IF:   You have increased pain, redness, or swelling in the bite area.  You see a red line on the skin coming from the bite.  You have a fever.  You have joint pain.  You have a headache or neck pain.  You have unusual weakness.  You have a rash.  You have chest pain or shortness of breath.  You have abdominal pain, nausea, or vomiting.  You feel unusually tired or sleepy. MAKE SURE YOU:   Understand these instructions.  Will watch your condition.  Will get help right away if you are not doing well or get worse. Document Released: 11/19/2004 Document Revised: 01/04/2012 Document Reviewed: 05/13/2011 [facility] Patient Information 2015 [place], [place]. This information is not intended to replace advice given to you by your health care provider. Make sure you discuss any questions you have with your health care provider.

## 2015-03-29 ENCOUNTER — Encounter: Payer: Self-pay | Admitting: *Deleted

## 2017-12-06 ENCOUNTER — Other Ambulatory Visit: Payer: Self-pay

## 2017-12-06 ENCOUNTER — Ambulatory Visit: Payer: BLUE CROSS/BLUE SHIELD | Admitting: Family Medicine

## 2017-12-06 ENCOUNTER — Encounter: Payer: Self-pay | Admitting: Family Medicine

## 2017-12-06 VITALS — BP 118/70 | HR 59 | Temp 98.6°F | Resp 18 | Ht 67.0 in | Wt 152.6 lb

## 2017-12-06 DIAGNOSIS — J040 Acute laryngitis: Secondary | ICD-10-CM

## 2017-12-06 DIAGNOSIS — J301 Allergic rhinitis due to pollen: Secondary | ICD-10-CM

## 2017-12-06 MED ORDER — FLUTICASONE PROPIONATE 50 MCG/ACT NA SUSP: 2.0000 | Freq: Every day | NASAL | 2 refills | Status: DC

## 2017-12-06 MED ORDER — CETIRIZINE-PSEUDOEPHEDRINE ER 5-120 MG PO TB12: 1.0000 | ORAL_TABLET | Freq: Two times a day (BID) | ORAL | 1 refills | Status: DC

## 2017-12-06 NOTE — Patient Instructions (Addendum)
I recommend frequent warm salt water gargles, hot tea with honey and lemon, rest, and handwashing.  Hot showers or breathing in steam may help loosen the congestion.  Continue nasal saline, saline gel, and/or a humidifier next to your bed at night which is also likely to help you feel better and keep this from progressing.   If you are having trouble sleeping at night, try diphenhydramine (Benadryl, Tylenol PM, Advil PM) and if this doesn't work, stop taking the cetirizine-pseudoephedrine at night.  When you start feeling better, decrease the cetirizine-pseudoephedrine to morning only and then you can stop the cetirizine-pseudoephendrine when you are all the way well - completely back to normal. Continue on the fluticasone nasal spray for at least 2 weeks.  We recommend that you schedule a mammogram for breast cancer screening. Typically, you do not need a referral to do this. Please contact a local imaging center to schedule your mammogram.  [facility] - [phone]  *ask for the Radiology Department [facility]) - [phone] or [phone]  [facility] - [phone] [facility] - [phone] [facility] - [phone]  *ask for the Radiology Department [facility] - [phone]  *ask for the Radiology Department [facility] - [phone]  *ask for the Mammography Department [facility] - [phone]   IF you received an x-ray today, you will receive an invoice from [facility]. Please contact [facility] at [phone] with questions or concerns regarding your invoice.   IF you received labwork today, you will receive an invoice from [place]. Please contact [facility] at [phone] with questions or concerns regarding your invoice.   Our billing staff will not be able to assist you with questions regarding bills from these companies.  You will be  contacted with the lab results as soon as they are available. The fastest way to get your results is to activate your My Chart account. Instructions are located on the last page of this paperwork. If you have not heard from [place] regarding the results in 2 weeks, please contact this office.     Laryngitis Laryngitis is inflammation of your vocal cords. This causes hoarseness, coughing, loss of voice, sore throat, or a dry throat. Your vocal cords are two bands of muscles that are found in your throat. When you speak, these cords come together and vibrate. These vibrations come out through your mouth as sound. When your vocal cords are inflamed, your voice sounds different. Laryngitis can be temporary (acute) or long-term (chronic). Most cases of acute laryngitis improve with time. Chronic laryngitis is laryngitis that lasts for more than three weeks. What are the causes? Acute laryngitis may be caused by:  A viral infection.  Lots of talking, yelling, or singing. This is also called vocal strain.  Bacterial infections.  Chronic laryngitis may be caused by:  Vocal strain.  Injury to your vocal cords.  Acid reflux (gastroesophageal reflux disease or GERD).  Allergies.  Sinus infection.  Smoking.  Alcohol abuse.  Breathing in chemicals or dust.  Growths on the vocal cords.  What increases the risk? Risk factors for laryngitis include:  Smoking.  Alcohol abuse.  Having allergies.  What are the signs or symptoms? Symptoms of laryngitis may include:  Low, hoarse voice.  Loss of voice.  Dry cough.  Sore throat.  Stuffy nose.  How is this diagnosed? Laryngitis may be diagnosed by:  Physical exam.  Throat culture.  Blood test.  Laryngoscopy. This procedure allows your health care provider to look at your vocal cords with a mirror or viewing tube.  How is this treated? Treatment for laryngitis depends on what is causing it. Usually, treatment involves resting  your voice and using medicines to soothe your throat. However, if your laryngitis is caused by a bacterial infection, you may need to take antibiotic medicine. If your laryngitis is caused by a growth, you may need to have a procedure to remove it. Follow these instructions at home:  Drink enough fluid to keep your urine clear or pale yellow.  Breathe in moist air. Use a humidifier if you live in a dry climate.  Take medicines only as directed by your health care provider.  If you were prescribed an antibiotic medicine, finish it all even if you start to feel better.  Do not smoke cigarettes or electronic cigarettes. If you need help quitting, ask your health care provider.  Talk as little as possible. Also avoid whispering, which can cause vocal strain.  Write instead of talking. Do this until your voice is back to normal. Contact a health care provider if:  You have a fever.  You have increasing pain.  You have difficulty swallowing. Get help right away if:  You cough up blood.  You have trouble breathing. This information is not intended to replace advice given to you by your health care provider. Make sure you discuss any questions you have with your health care provider. Document Released: 10/12/2005 Document Revised: 03/19/2016 Document Reviewed: 03/27/2014 Elsevier Interactive Patient Education  2018 [employer] Allergic Rhinitis, Adult Allergic rhinitis is an allergic reaction that affects the mucous membrane inside the nose. It causes sneezing, a runny or stuffy nose, and the feeling of mucus going down the back of the throat (postnasal drip). Allergic rhinitis can be mild to severe. There are two types of allergic rhinitis:  Seasonal. This type is also called hay fever. It happens only during certain seasons.  Perennial. This type can happen at any time of the year.  What are the causes? This condition happens when the body's defense system (immune system)  responds to certain harmless substances called allergens as though they were germs.  Seasonal allergic rhinitis is triggered by pollen, which can come from grasses, trees, and weeds. Perennial allergic rhinitis may be caused by:  House dust mites.  Pet dander.  Mold spores.  What are the signs or symptoms? Symptoms of this condition include:  Sneezing.  Runny or stuffy nose (nasal congestion).  Postnasal drip.  Itchy nose.  Tearing of the eyes.  Trouble sleeping.  Daytime sleepiness.  How is this diagnosed? This condition may be diagnosed based on:  Your medical history.  A physical exam.  Tests to check for related conditions, such as: ? Asthma. ? Pink eye. ? Ear infection. ? Upper respiratory infection.  Tests to find out which allergens trigger your symptoms. These may include skin or blood tests.  How is this treated? There is no cure for this condition, but treatment can help control symptoms. Treatment may include:  Taking medicines that block allergy symptoms, such as antihistamines. Medicine may be given as a shot, nasal spray, or pill.  Avoiding the allergen.  Desensitization. This treatment involves getting ongoing shots until your body becomes less sensitive to the allergen. This treatment may be done if other treatments do not help.  If taking medicine and avoiding the allergen does not work, new, stronger medicines  may be prescribed.  Follow these instructions at home:  Find out what you are allergic to. Common allergens include smoke, dust, and pollen.  Avoid the things you are allergic to. These are some things you can do to help avoid allergens: ? Replace carpet with wood, tile, or vinyl flooring. Carpet can trap dander and dust. ? Do not smoke. Do not allow smoking in your home. ? Change your heating and air conditioning filter at least once a month. ? During allergy season:  Keep windows closed as much as possible.  Plan outdoor  activities when pollen counts are lowest. This is usually during the evening hours.  When coming indoors, change clothing and shower before sitting on furniture or bedding.  Take over-the-counter and prescription medicines only as told by your health care provider.  Keep all follow-up visits as told by your health care provider. This is important. Contact a health care provider if:  You have a fever.  You develop a persistent cough.  You make whistling sounds when you breathe (you wheeze).  Your symptoms interfere with your normal daily activities. Get help right away if:  You have shortness of breath. Summary  This condition can be managed by taking medicines as directed and avoiding allergens.  Contact your health care provider if you develop a persistent cough or fever.  During allergy season, keep windows closed as much as possible. This information is not intended to replace advice given to you by your health care provider. Make sure you discuss any questions you have with your health care provider. Document Released: 07/07/2001 Document Revised: 11/19/2016 Document Reviewed: 11/19/2016 Elsevier Interactive Patient Education  [employer].

## 2017-12-06 NOTE — Progress Notes (Signed)
Subjective:  By signing my name below, I, Stann Oresung-Kai Tsai, attest that this documentation has been prepared under the direction and in the presence of Norberto SorensonEva Chrisandra Wiemers, MD. Electronically Signed: Stann Oresung-Kai Tsai, Scribe. 12/06/2017 , 9:25 AM .  Patient was seen in Room 1 .   Patient ID: Shelby Powell, female    DOB: 05-25-1956, 62 y.o.   MRN: [id number] Chief Complaint  Patient presents with  . Cough    x4 days, pt states she is coughing up green and yellow mucous in the morning. Pt states clear mucous in the afternoon. Pt states she doesn't have any fullness or pain in her face. Pt states she hasn't tried anything OTC except for honey, lemon, and cayenne pepper. Pt states her throat is sore just loosing her voice.  Marland Kitchen. Hoarse   HPI Shelby Sextonocoa M Kovack is a 62 y.o. female who [facility] complaining of productive cough (green/yellow mucus) that started about 5 days ago. She reports noticing a tickle in her throat 5 days ago, and coughing up mucus 4 days ago. She hasn't taken any OTC medication, but has been trying home remedy of honey, lemon and cayenne pepper. She felt "perfect" yesterday, was able to hang out with her friends and talking. However, today, she noticed voice loss. She notes feeling fine, just concerned about her voice loss. She denies chest pain, palpitations, shortness of breath or trouble swallowing. She mentions using Xlear, saline nasal spray.   Past Medical History:  Diagnosis Date  . Allergy    Past Surgical History:  Procedure Laterality Date  . ABDOMINAL HYSTERECTOMY     Prior to Admission medications   Medication Sig Start Date End Date Taking? Authorizing Provider  benzonatate (TESSALON) 100 MG capsule Take 1 capsule (100 mg total) by mouth 3 (three) times daily as needed for cough. Patient not taking: Reported on 12/06/2017 04/16/14   Shade FloodGreene, Jeffrey R, MD   Allergies  Allergen Reactions  . Penicillins Hives and Itching   History reviewed. No  pertinent family history. Social History   Socioeconomic History  . Marital status: Married    Spouse name: None  . Number of children: None  . Years of education: None  . Highest education level: None  Social Needs  . Financial resource strain: None  . Food insecurity - worry: None  . Food insecurity - inability: None  . Transportation needs - medical: None  . Transportation needs - non-medical: None  Occupational History  . None  Tobacco Use  . Smoking status: Never Smoker  . Smokeless tobacco: Never Used  Substance and Sexual Activity  . Alcohol use: No    Frequency: Never  . Drug use: No  . Sexual activity: None  Other Topics Concern  . None  Social History Narrative  . None   [facility] 12/06/2017  Decreased Interest 0  Down, Depressed, Hopeless 0  PHQ - 2 Score 0    Review of Systems  Constitutional: Negative for chills, fatigue, fever and unexpected weight change.  HENT: Positive for congestion, sore throat and voice change.   Respiratory: Positive for cough. Negative for chest tightness, shortness of breath and wheezing.   Cardiovascular: Negative for chest pain and palpitations.  Gastrointestinal: Negative for constipation, diarrhea, nausea and vomiting.  Skin: Negative for rash and wound.  Allergic/Immunologic: Positive for environmental allergies.  Neurological: Negative for dizziness, weakness and headaches.       Objective:   Physical Exam  Constitutional: She is oriented to person, place, and time. She appears well-developed and well-nourished. No distress.  HENT:  Head: Normocephalic and atraumatic.  Right Ear: Tympanic membrane normal.  Left Ear: Tympanic membrane normal.  Mouth/Throat: No oropharyngeal exudate or posterior oropharyngeal erythema.  Mucosa pale and boggy; clear postnasal drip  Eyes: EOM are normal. Pupils are equal, round, and reactive to light.  Neck: Neck supple. No thyromegaly present.  Cardiovascular: Normal  rate, regular rhythm, S1 normal, S2 normal and normal heart sounds.  No murmur heard. Pulmonary/Chest: Effort normal and breath sounds normal. No respiratory distress. She has no wheezes.  Musculoskeletal: Normal range of motion.  Lymphadenopathy:    She has no cervical adenopathy.  Neurological: She is alert and oriented to person, place, and time.  Skin: Skin is warm and dry.  Psychiatric: She has a normal mood and affect. Her behavior is normal.  Nursing note and vitals reviewed.   BP 118/70 (BP Location: Left Arm, Patient Position: Sitting, Cuff Size: Normal)   Pulse (!) 59   Temp 98.6 F (37 C) (Oral)   Resp 18   Ht 5\' 7"  (1.702 m)   Wt 152 lb 9.6 oz (69.2 kg)   LMP 09/25/1984   SpO2 100%   BMI 23.90 kg/m      Assessment & Plan:   1. Seasonal allergic rhinitis due to pollen   2. Laryngitis, acute   See AVS care rec details inc rec home remedies and other otc recs - humidifier, nasal saline, etc.  Meds ordered this encounter  Medications  . cetirizine-pseudoephedrine (ZYRTEC-D) 5-120 MG tablet    Sig: Take 1 tablet by mouth 2 (two) times daily.    Dispense:  30 tablet    Refill:  1  . fluticasone (FLONASE) 50 MCG/ACT nasal spray    Sig: Place 2 sprays into both nostrils at bedtime.    Dispense:  16 g    Refill:  2    I personally performed the services described in this documentation, which was scribed in my presence. The recorded information has been reviewed and considered, and addended by me as needed.   Norberto Sorenson, M.D.  Primary Care at [facility] [address] [place], [place] [address] [phone] phone [phone] fax  12/09/17 2:15 AM

## 2017-12-13 ENCOUNTER — Telehealth: Payer: Self-pay | Admitting: Family Medicine

## 2017-12-13 NOTE — Telephone Encounter (Signed)
Copied from CRM [phone]. Topic: Quick Communication - See Telephone Encounter >> Dec 13, 2017  2:56 PM Oneal GroutSebastian, Jennifer S wrote: CRM for notification. See Telephone encounter for:  Patient wanted to let the dr know that her symptoms cleared up and she did not end up taking fluticasone (FLONASE) 50 MCG/ACT nasal spray or cetirizine-pseudoephedrine (ZYRTEC-D) 5-120 MG tablet  12/13/17.

## 2018-04-18 ENCOUNTER — Ambulatory Visit: Payer: BLUE CROSS/BLUE SHIELD | Admitting: Physician Assistant

## 2020-03-23 ENCOUNTER — Ambulatory Visit: Payer: Self-pay | Attending: Internal Medicine

## 2020-03-23 DIAGNOSIS — Z23 Encounter for immunization: Secondary | ICD-10-CM

## 2020-03-23 NOTE — Progress Notes (Signed)
   Covid-19 Vaccination Clinic  Name:  CORNEISHA ALVI    MRN: [id number] DOB: [birth year]  03/23/2020  Ms. Butler was observed post Covid-19 immunization for 15 minutes without incident. She was provided with Vaccine Information Sheet and instruction to access the V-Safe system.   Ms. Beaudoin was instructed to call 911 with any severe reactions post vaccine: Marland Kitchen Difficulty breathing  . Swelling of face and throat  . A fast heartbeat  . A bad rash all over body  . Dizziness and weakness   Immunizations Administered    Name Date Dose VIS Date Route   Pfizer COVID-19 Vaccine 03/23/2020  8:12 AM 0.3 mL 12/20/2018 Intramuscular   Manufacturer: [employer], [employer]   Lot: [id number]   NDC: [address]-5

## 2020-04-15 ENCOUNTER — Ambulatory Visit: Payer: Self-pay | Attending: Internal Medicine

## 2020-04-15 DIAGNOSIS — Z23 Encounter for immunization: Secondary | ICD-10-CM

## 2020-04-15 NOTE — Progress Notes (Signed)
° °  Covid-19 Vaccination Clinic  Name:  Shelby Powell    MRN: [id number] DOB: 1956-10-18  04/15/2020  [name] was observed post Covid-19 immunization for 15 minutes without incident. She was provided with Vaccine Information Sheet and instruction to access the V-Safe system.   [name] was instructed to call 911 with any severe reactions post vaccine:  Difficulty breathing   Swelling of face and throat   A fast heartbeat   A bad rash all over body   Dizziness and weakness   Immunizations Administered    Name Date Dose VIS Date Route   Pfizer COVID-19 Vaccine 04/15/2020  8:12 AM 0.3 mL 12/20/2018 Intramuscular   Manufacturer: [employer], [employer]   Lot: [id number]   NDC: [address]-0

## 2020-10-12 ENCOUNTER — Ambulatory Visit: Payer: Self-pay | Attending: Internal Medicine

## 2020-10-12 DIAGNOSIS — Z23 Encounter for immunization: Secondary | ICD-10-CM

## 2020-10-12 NOTE — Progress Notes (Signed)
   Covid-19 Vaccination Clinic  Name:  ABIHA LUKEHART    MRN: [id number] DOB: 23-Apr-1956  10/12/2020  Ms. Dayley was observed post Covid-19 immunization for 15 minutes without incident. She was provided with Vaccine Information Sheet and instruction to access the V-Safe system.   Ms. Monterroso was instructed to call 911 with any severe reactions post vaccine: Marland Kitchen Difficulty breathing  . Swelling of face and throat  . A fast heartbeat  . A bad rash all over body  . Dizziness and weakness   Immunizations Administered    Name Date Dose VIS Date Route   Pfizer COVID-19 Vaccine 10/12/2020  9:20 AM 0.3 mL 08/14/2020 Intramuscular   Manufacturer: [employer], [employer]   Lot: [id number]   NDC: [address]-2

## 2021-04-14 ENCOUNTER — Encounter: Payer: Self-pay | Admitting: Internal Medicine

## 2021-04-14 ENCOUNTER — Other Ambulatory Visit: Payer: Self-pay

## 2021-04-14 ENCOUNTER — Ambulatory Visit (INDEPENDENT_AMBULATORY_CARE_PROVIDER_SITE_OTHER): Payer: Medicare Other | Admitting: Internal Medicine

## 2021-04-14 VITALS — BP 144/84 | HR 62 | Temp 98.2°F | Resp 16 | Ht 67.0 in | Wt 156.0 lb

## 2021-04-14 DIAGNOSIS — E559 Vitamin D deficiency, unspecified: Secondary | ICD-10-CM

## 2021-04-14 DIAGNOSIS — I10 Essential (primary) hypertension: Secondary | ICD-10-CM

## 2021-04-14 DIAGNOSIS — Z1211 Encounter for screening for malignant neoplasm of colon: Secondary | ICD-10-CM

## 2021-04-14 DIAGNOSIS — E785 Hyperlipidemia, unspecified: Secondary | ICD-10-CM

## 2021-04-14 DIAGNOSIS — Z0001 Encounter for general adult medical examination with abnormal findings: Secondary | ICD-10-CM | POA: Diagnosis not present

## 2021-04-14 DIAGNOSIS — Z1231 Encounter for screening mammogram for malignant neoplasm of breast: Secondary | ICD-10-CM

## 2021-04-14 LAB — URINALYSIS, ROUTINE W REFLEX MICROSCOPIC
Bilirubin Urine: NEGATIVE
Hgb urine dipstick: NEGATIVE
Ketones, ur: NEGATIVE
Leukocytes,Ua: NEGATIVE
Nitrite: NEGATIVE
RBC / HPF: NONE SEEN (ref 0–?)
Specific Gravity, Urine: <=1.005 — AB (ref 1.000–1.030)
Total Protein, Urine: NEGATIVE
Urine Glucose: NEGATIVE
Urobilinogen, UA: 0.2 (ref 0.0–1.0)
pH: 6 (ref 5.0–8.0)

## 2021-04-14 LAB — BASIC METABOLIC PANEL
BUN: 13 mg/dL (ref 6–23)
CO2: 31 mEq/L (ref 19–32)
Calcium: 9.3 mg/dL (ref 8.4–10.5)
Chloride: 104 mEq/L (ref 96–112)
Creatinine, Ser: 0.97 mg/dL (ref 0.40–1.20)
GFR: 60.97 mL/min (ref >60.00)
Glucose, Bld: 85 mg/dL (ref 70–99)
Potassium: 4 mEq/L (ref 3.5–5.1)
Sodium: 141 mEq/L (ref 135–145)

## 2021-04-14 LAB — CBC WITH DIFFERENTIAL/PLATELET
Basophils Absolute: 0.1 10*3/uL (ref 0.0–0.1)
Basophils Relative: 1.8 % (ref 0.0–3.0)
Eosinophils Absolute: 0.1 10*3/uL (ref 0.0–0.7)
Eosinophils Relative: 3.2 % (ref 0.0–5.0)
HCT: 39.6 % (ref 36.0–46.0)
Hemoglobin: 13.7 g/dL (ref 12.0–15.0)
Lymphocytes Relative: 42.7 % (ref 12.0–46.0)
Lymphs Abs: 1.3 10*3/uL (ref 0.7–4.0)
MCHC: 34.5 g/dL (ref 30.0–36.0)
MCV: 89.4 fl (ref 78.0–100.0)
Monocytes Absolute: 0.4 10*3/uL (ref 0.1–1.0)
Monocytes Relative: 12.2 % — ABNORMAL HIGH (ref 3.0–12.0)
Neutro Abs: 1.2 10*3/uL — ABNORMAL LOW (ref 1.4–7.7)
Neutrophils Relative %: 40.1 % — ABNORMAL LOW (ref 43.0–77.0)
Platelets: 223 10*3/uL (ref 150.0–400.0)
RBC: 4.43 Mil/uL (ref 3.87–5.11)
RDW: 13.7 % (ref 11.5–15.5)
WBC: 3 10*3/uL — ABNORMAL LOW (ref 4.0–10.5)

## 2021-04-14 LAB — HEPATIC FUNCTION PANEL
ALT: 14 U/L (ref 0–35)
AST: 18 U/L (ref 0–37)
Albumin: 4.3 g/dL (ref 3.5–5.2)
Alkaline Phosphatase: 65 U/L (ref 39–117)
Bilirubin, Direct: 0.1 mg/dL (ref 0.0–0.3)
Total Bilirubin: 0.4 mg/dL (ref 0.2–1.2)
Total Protein: 7.4 g/dL (ref 6.0–8.3)

## 2021-04-14 LAB — LIPID PANEL
Cholesterol: 231 mg/dL — ABNORMAL HIGH (ref 0–200)
HDL: 78.4 mg/dL (ref >39.00)
LDL Cholesterol: 137 mg/dL — ABNORMAL HIGH (ref 0–99)
NonHDL: 153.03
Total CHOL/HDL Ratio: 3
Triglycerides: 82 mg/dL (ref 0.0–149.0)
VLDL: 16.4 mg/dL (ref 0.0–40.0)

## 2021-04-14 LAB — TSH: TSH: 2.11 u[IU]/mL (ref 0.35–4.50)

## 2021-04-14 LAB — VITAMIN D 25 HYDROXY (VIT D DEFICIENCY, FRACTURES): VITD: 15.03 ng/mL — ABNORMAL LOW (ref 30.00–100.00)

## 2021-04-14 MED ORDER — CHOLECALCIFEROL 1.25 MG (50000 UT) PO CAPS: 50000.0000 [IU] | ORAL_CAPSULE | ORAL | 1 refills | Status: AC

## 2021-04-14 NOTE — Progress Notes (Signed)
Subjective:  Patient ID: Shelby Powell, female    DOB: 01-11-1956  Age: 65 y.o. MRN: [id number]  CC: Annual Exam, Hypertension, and Hyperlipidemia  This visit occurred during the SARS-CoV-2 public health emergency.  Safety protocols were in place, including screening questions prior to the visit, additional usage of staff PPE, and extensive cleaning of exam room while observing appropriate contact time as indicated for disinfecting solutions.    HPI Shelby Powell presents for a CPX, f/up, and to establish.  She tells me her blood pressure at home is around 120/76.  She is very active and does a lot of gardening.  She denies CP, DOE, palpitations, dizziness, lightheadedness, near-syncope, diaphoresis, edema, or fatigue.  History Shelby Powell has a past medical history of Allergy.   She has a past surgical history that includes Abdominal hysterectomy and colpectomy (10/04/1998).   Her family history is not on file.She reports that she has never smoked. She has never used smokeless tobacco. She reports that she does not drink alcohol and does not use drugs.  Outpatient Medications Prior to Visit  Medication Sig Dispense Refill   benzonatate (TESSALON) 100 MG capsule Take 1 capsule (100 mg total) by mouth 3 (three) times daily as needed for cough. 20 capsule 0   cetirizine-pseudoephedrine (ZYRTEC-D) 5-120 MG tablet Take 1 tablet by mouth 2 (two) times daily. 30 tablet 1   fluticasone (FLONASE) 50 MCG/ACT nasal spray Place 2 sprays into both nostrils at bedtime. 16 g 2   No facility-administered medications prior to visit.    ROS Review of Systems  Constitutional:  Negative for diaphoresis, fatigue and unexpected weight change.  HENT: Negative.    Eyes: Negative.   Respiratory:  Negative for cough, chest tightness, shortness of breath and wheezing.   Cardiovascular:  Negative for chest pain, palpitations and leg swelling.  Gastrointestinal:  Negative for abdominal pain, constipation,  diarrhea, nausea and vomiting.  Endocrine: Negative.  Negative for polyuria.  Genitourinary: Negative.  Negative for difficulty urinating and hematuria.  Musculoskeletal:  Negative for arthralgias and myalgias.  Skin: Negative.   Neurological:  Negative for dizziness, weakness, light-headedness, numbness and headaches.  Hematological:  Negative for adenopathy. Does not bruise/bleed easily.  Psychiatric/Behavioral: Negative.     Objective:  BP (!) 144/84 (BP Location: Right Arm, Patient Position: Sitting, Cuff Size: Large)   Pulse 62   Temp 98.2 F (36.8 C) (Oral)   Resp 16   Ht 5\' 7"  (1.702 m)   Wt 156 lb (70.8 kg)   LMP 09/25/1984   SpO2 99%   BMI 24.43 kg/m   Physical Exam Vitals reviewed.  Constitutional:      Appearance: Normal appearance.  HENT:     Nose: Nose normal.     Mouth/Throat:     Mouth: Mucous membranes are moist.  Eyes:     General: No scleral icterus.    Conjunctiva/sclera: Conjunctivae normal.  Cardiovascular:     Rate and Rhythm: Regular rhythm. Bradycardia present.     Heart sounds: Normal heart sounds, S1 normal and S2 normal. No murmur heard.   No gallop.     Comments: EKG- Sinus bradycardia, 59 bpm Downsloping ST segments and TWI in anterolateral leads - nonspecific  No LVH or Q waves Pulmonary:     Effort: Pulmonary effort is normal.     Breath sounds: No stridor. No wheezing, rhonchi or rales.  Abdominal:     General: Abdomen is flat.     Palpations: There is no  mass.     Tenderness: There is no abdominal tenderness. There is no guarding.  Musculoskeletal:        General: Normal range of motion.     Cervical back: Neck supple.     Right lower leg: No edema.     Left lower leg: No edema.  Lymphadenopathy:     Cervical: No cervical adenopathy.  Skin:    General: Skin is warm and dry.     Coloration: Skin is not pale.  Neurological:     General: No focal deficit present.     Mental Status: She is alert.  Psychiatric:        Mood and  Affect: Mood normal.        Behavior: Behavior normal.    Lab Results  Component Value Date   WBC 3.0 (L) 04/14/2021   HGB 13.7 04/14/2021   HCT 39.6 04/14/2021   PLT 223.0 04/14/2021   GLUCOSE 85 04/14/2021   CHOL 231 (H) 04/14/2021   TRIG 82.0 04/14/2021   HDL 78.40 04/14/2021   LDLCALC 137 (H) 04/14/2021   ALT 14 04/14/2021   AST 18 04/14/2021   NA 141 04/14/2021   K 4.0 04/14/2021   CL 104 04/14/2021   CREATININE 0.97 04/14/2021   BUN 13 04/14/2021   CO2 31 04/14/2021   TSH 2.11 04/14/2021     Assessment & Plan:   Shelby Powell was seen today for annual exam, hypertension and hyperlipidemia.  Diagnoses and all orders for this visit:  Primary hypertension- She believes this is whitecoat hypertension.  She would prefer not to take an antihypertensive.  Labs are negative for secondary causes or endorgan damage. -     CBC with Differential/Platelet; Future -     Basic metabolic panel; Future -     VITAMIN D 25 Hydroxy (Vit-D Deficiency, Fractures); Future -     Urinalysis, Routine w reflex microscopic; Future -     Urinalysis, Routine w reflex microscopic -     VITAMIN D 25 Hydroxy (Vit-D Deficiency, Fractures) -     Basic metabolic panel -     CBC with Differential/Platelet -     EKG 12-Lead  Encounter for general adult medical examination with abnormal findings- Exam completed, labs reviewed, vaccines reviewed - She refused a pneumonia vaccine today, cancer screenings addressed, patient education material was given.  Screen for colon cancer -     Cologuard  Visit for screening mammogram -     MM DIGITAL SCREENING BILATERAL; Future  Hyperlipidemia with target LDL less than 130- Statin therapy is not indicated. -     Lipid panel; Future -     TSH; Future -     Hepatic function panel; Future -     Hepatic function panel -     TSH -     Lipid panel  Vitamin D deficiency disease -     Cholecalciferol 1.25 MG (50000 UT) capsule; Take 1 capsule (50,000 Units total) by  mouth once a week.  I have discontinued Shelby Powell's benzonatate, cetirizine-pseudoephedrine, and fluticasone. I am also having her start on Cholecalciferol.  Meds ordered this encounter  Medications   Cholecalciferol 1.25 MG (50000 UT) capsule    Sig: Take 1 capsule (50,000 Units total) by mouth once a week.    Dispense:  12 capsule    Refill:  1      Follow-up: Return in about 6 months (around 10/14/2021).  Sanda Linger, MD

## 2021-04-14 NOTE — Patient Instructions (Signed)
Health Maintenance, Female Adopting a healthy lifestyle and getting preventive care are important in promoting health and wellness. Ask your health care provider about: The right schedule for you to have regular tests and exams. Things you can do on your own to prevent diseases and keep yourself healthy. What should I know about diet, weight, and exercise? Eat a healthy diet  Eat a diet that includes plenty of vegetables, fruits, low-fat dairy products, and lean protein. Do not eat a lot of foods that are high in solid fats, added sugars, or sodium.  Maintain a healthy weight Body mass index (BMI) is used to identify weight problems. It estimates body fat based on height and weight. Your health care provider can help determineyour BMI and help you achieve or maintain a healthy weight. Get regular exercise Get regular exercise. This is one of the most important things you can do for your health. Most adults should: Exercise for at least 150 minutes each week. The exercise should increase your heart rate and make you sweat (moderate-intensity exercise). Do strengthening exercises at least twice a week. This is in addition to the moderate-intensity exercise. Spend less time sitting. Even light physical activity can be beneficial. Watch cholesterol and blood lipids Have your blood tested for lipids and cholesterol at 66 years of age, then havethis test every 5 years. Have your cholesterol levels checked more often if: Your lipid or cholesterol levels are high. You are older than 66 years of age. You are at high risk for heart disease. What should I know about cancer screening? Depending on your health history and family history, you may need to have cancer screening at various ages. This may include screening for: Breast cancer. Cervical cancer. Colorectal cancer. Skin cancer. Lung cancer. What should I know about heart disease, diabetes, and high blood pressure? Blood pressure and heart  disease High blood pressure causes heart disease and increases the risk of stroke. This is more likely to develop in people who have high blood pressure readings, are of African descent, or are overweight. Have your blood pressure checked: Every 3-5 years if you are 18-39 years of age. Every year if you are 40 years old or older. Diabetes Have regular diabetes screenings. This checks your fasting blood sugar level. Have the screening done: Once every three years after age 40 if you are at a normal weight and have a low risk for diabetes. More often and at a younger age if you are overweight or have a high risk for diabetes. What should I know about preventing infection? Hepatitis B If you have a higher risk for hepatitis B, you should be screened for this virus. Talk with your health care provider to find out if you are at risk forhepatitis B infection. Hepatitis C Testing is recommended for: Everyone born from 1945 through 1965. Anyone with known risk factors for hepatitis C. Sexually transmitted infections (STIs) Get screened for STIs, including gonorrhea and chlamydia, if: You are sexually active and are younger than 66 years of age. You are older than 66 years of age and your health care provider tells you that you are at risk for this type of infection. Your sexual activity has changed since you were last screened, and you are at increased risk for chlamydia or gonorrhea. Ask your health care provider if you are at risk. Ask your health care provider about whether you are at high risk for HIV. Your health care provider may recommend a prescription medicine to help   prevent HIV infection. If you choose to take medicine to prevent HIV, you should first get tested for HIV. You should then be tested every 3 months for as long as you are taking the medicine. Pregnancy If you are about to stop having your period (premenopausal) and you may become pregnant, seek counseling before you get  pregnant. Take 400 to 800 micrograms (mcg) of folic acid every day if you become pregnant. Ask for birth control (contraception) if you want to prevent pregnancy. Osteoporosis and menopause Osteoporosis is a disease in which the bones lose minerals and strength with aging. This can result in bone fractures. If you are 65 years old or older, or if you are at risk for osteoporosis and fractures, ask your health care provider if you should: Be screened for bone loss. Take a calcium or vitamin D supplement to lower your risk of fractures. Be given hormone replacement therapy (HRT) to treat symptoms of menopause. Follow these instructions at home: Lifestyle Do not use any products that contain nicotine or tobacco, such as cigarettes, e-cigarettes, and chewing tobacco. If you need help quitting, ask your health care provider. Do not use street drugs. Do not share needles. Ask your health care provider for help if you need support or information about quitting drugs. Alcohol use Do not drink alcohol if: Your health care provider tells you not to drink. You are pregnant, may be pregnant, or are planning to become pregnant. If you drink alcohol: Limit how much you use to 0-1 drink a day. Limit intake if you are breastfeeding. Be aware of how much alcohol is in your drink. In the [place] one drink equals one 12 oz bottle of beer (355 mL), one 5 oz glass of wine (148 mL), or one 1 oz glass of hard liquor (44 mL). General instructions Schedule regular health, dental, and eye exams. Stay current with your vaccines. Tell your health care provider if: You often feel depressed. You have ever been abused or do not feel safe at home. Summary Adopting a healthy lifestyle and getting preventive care are important in promoting health and wellness. Follow your health care provider's instructions about healthy diet, exercising, and getting tested or screened for diseases. Follow your health care provider's  instructions on monitoring your cholesterol and blood pressure. This information is not intended to replace advice given to you by your health care provider. Make sure you discuss any questions you have with your healthcare provider. Document Revised: 10/05/2018 Document Reviewed: 10/05/2018 Elsevier Patient Education  2022 [employer]  

## 2021-04-18 ENCOUNTER — Encounter: Payer: Self-pay | Admitting: Internal Medicine

## 2021-04-22 ENCOUNTER — Encounter: Payer: Self-pay | Admitting: Internal Medicine

## 2021-04-24 LAB — COLOGUARD
Cologuard: NEGATIVE
Cologuard: NEGATIVE

## 2021-07-01 DIAGNOSIS — H6981 Other specified disorders of Eustachian tube, right ear: Secondary | ICD-10-CM | POA: Diagnosis not present

## 2022-04-16 DIAGNOSIS — E559 Vitamin D deficiency, unspecified: Secondary | ICD-10-CM | POA: Diagnosis not present

## 2022-04-16 DIAGNOSIS — D72819 Decreased white blood cell count, unspecified: Secondary | ICD-10-CM | POA: Diagnosis not present

## 2022-04-16 DIAGNOSIS — Z0000 Encounter for general adult medical examination without abnormal findings: Secondary | ICD-10-CM | POA: Diagnosis not present

## 2022-04-16 DIAGNOSIS — E7800 Pure hypercholesterolemia, unspecified: Secondary | ICD-10-CM | POA: Diagnosis not present

## 2022-07-02 ENCOUNTER — Telehealth: Payer: Self-pay

## 2022-07-02 NOTE — Patient Outreach (Signed)
  Care Coordination   07/02/2022 Name: Shelby Powell MRN: [id number] DOB: 02-05-1956   Care Coordination Outreach Attempts:  An unsuccessful telephone outreach was attempted today to offer the patient information about available care coordination services as a benefit of their health plan.   Follow Up Plan:  Additional outreach attempts will be made to offer the patient care coordination information and services.   Encounter Outcome:  No Answer  Care Coordination Interventions Activated:  No   Care Coordination Interventions:  No, not indicated    [facility] [phone]

## 2022-07-17 ENCOUNTER — Telehealth: Payer: Self-pay

## 2022-07-17 NOTE — Patient Outreach (Signed)
  Care Coordination   07/17/2022 Name: Shelby Powell MRN: [id number] DOB: 09/11/1956   Care Coordination Outreach Attempts:  A second unsuccessful outreach was attempted today to offer the patient with information about available care coordination services as a benefit of their health plan.     Follow Up Plan:  Additional outreach attempts will be made to offer the patient care coordination information and services.   Encounter Outcome:  No Answer  Care Coordination Interventions Activated:  No   Care Coordination Interventions:  No, not indicated    [place] [facility] [phone]

## 2022-07-30 ENCOUNTER — Telehealth: Payer: Self-pay

## 2022-07-30 NOTE — Patient Outreach (Signed)
  Care Coordination   07/30/2022 Name: DEMARIA DEENEY MRN: [id number] DOB: 23-Nov-1955   Care Coordination Outreach Attempts:  A third unsuccessful outreach was attempted today to offer the patient with information about available care coordination services as a benefit of their health plan.   Follow Up Plan:  No further outreach attempts will be made at this time. We have been unable to contact the patient to offer or enroll patient in care coordination services  Encounter Outcome:  No Answer  Care Coordination Interventions Activated:  No   Care Coordination Interventions:  No, not indicated    [place] [facility] [phone]

## 2023-11-19 DIAGNOSIS — M25511 Pain in right shoulder: Secondary | ICD-10-CM | POA: Diagnosis not present

## 2024-06-15 DIAGNOSIS — Z0000 Encounter for general adult medical examination without abnormal findings: Secondary | ICD-10-CM | POA: Diagnosis not present

## 2024-06-15 DIAGNOSIS — Z1231 Encounter for screening mammogram for malignant neoplasm of breast: Secondary | ICD-10-CM | POA: Diagnosis not present

## 2024-06-15 DIAGNOSIS — Z13820 Encounter for screening for osteoporosis: Secondary | ICD-10-CM | POA: Diagnosis not present

## 2024-06-15 DIAGNOSIS — D72819 Decreased white blood cell count, unspecified: Secondary | ICD-10-CM | POA: Diagnosis not present

## 2024-06-15 DIAGNOSIS — Z23 Encounter for immunization: Secondary | ICD-10-CM | POA: Diagnosis not present

## 2024-06-15 DIAGNOSIS — M25511 Pain in right shoulder: Secondary | ICD-10-CM | POA: Diagnosis not present

## 2024-06-15 DIAGNOSIS — Z1159 Encounter for screening for other viral diseases: Secondary | ICD-10-CM | POA: Diagnosis not present

## 2024-06-15 DIAGNOSIS — Z1211 Encounter for screening for malignant neoplasm of colon: Secondary | ICD-10-CM | POA: Diagnosis not present

## 2024-06-15 DIAGNOSIS — E559 Vitamin D deficiency, unspecified: Secondary | ICD-10-CM | POA: Diagnosis not present

## 2024-06-15 DIAGNOSIS — E7800 Pure hypercholesterolemia, unspecified: Secondary | ICD-10-CM | POA: Diagnosis not present
